# Patient Record
Sex: Male | Born: 1975 | Race: White | Hispanic: No | Marital: Single | Smoking: Former smoker
Health system: Southern US, Community
[De-identification: ages and names within clinical notes are randomized; demographics above are authoritative.]

## PROBLEM LIST (undated history)

## (undated) DIAGNOSIS — I9789 Other postprocedural complications and disorders of the circulatory system, not elsewhere classified: Secondary | ICD-10-CM

## (undated) DIAGNOSIS — I4729 Other ventricular tachycardia: Secondary | ICD-10-CM

## (undated) DIAGNOSIS — Z9289 Personal history of other medical treatment: Secondary | ICD-10-CM

## (undated) DIAGNOSIS — I255 Ischemic cardiomyopathy: Secondary | ICD-10-CM

## (undated) DIAGNOSIS — Z95810 Presence of automatic (implantable) cardiac defibrillator: Secondary | ICD-10-CM

## (undated) DIAGNOSIS — I480 Paroxysmal atrial fibrillation: Secondary | ICD-10-CM

## (undated) DIAGNOSIS — I5022 Chronic systolic (congestive) heart failure: Secondary | ICD-10-CM

## (undated) DIAGNOSIS — I2510 Atherosclerotic heart disease of native coronary artery without angina pectoris: Secondary | ICD-10-CM

## (undated) DIAGNOSIS — K219 Gastro-esophageal reflux disease without esophagitis: Secondary | ICD-10-CM

## (undated) DIAGNOSIS — E785 Hyperlipidemia, unspecified: Secondary | ICD-10-CM

## (undated) DIAGNOSIS — I4891 Unspecified atrial fibrillation: Secondary | ICD-10-CM

## (undated) DIAGNOSIS — Q8901 Asplenia (congenital): Secondary | ICD-10-CM

## (undated) DIAGNOSIS — I252 Old myocardial infarction: Secondary | ICD-10-CM

## (undated) DIAGNOSIS — I10 Essential (primary) hypertension: Secondary | ICD-10-CM

## (undated) DIAGNOSIS — I472 Ventricular tachycardia: Secondary | ICD-10-CM

## (undated) HISTORY — DX: Old myocardial infarction: I25.2

## (undated) HISTORY — DX: Paroxysmal atrial fibrillation: I48.0

## (undated) HISTORY — PX: CORONARY STENT PLACEMENT: SHX1402

## (undated) HISTORY — DX: Personal history of other medical treatment: Z92.89

## (undated) HISTORY — DX: Asplenia (congenital): Q89.01

## (undated) HISTORY — DX: Ischemic cardiomyopathy: I25.5

## (undated) HISTORY — PX: CORONARY ARTERY BYPASS GRAFT: SHX141

## (undated) HISTORY — DX: Other ventricular tachycardia: I47.29

## (undated) HISTORY — DX: Hyperlipidemia, unspecified: E78.5

## (undated) HISTORY — DX: Chronic systolic (congestive) heart failure: I50.22

## (undated) HISTORY — DX: Gastro-esophageal reflux disease without esophagitis: K21.9

## (undated) HISTORY — DX: Other postprocedural complications and disorders of the circulatory system, not elsewhere classified: I97.89

## (undated) HISTORY — PX: SPLENECTOMY, TOTAL: SHX788

## (undated) HISTORY — DX: Ventricular tachycardia: I47.2

## (undated) HISTORY — DX: Presence of automatic (implantable) cardiac defibrillator: Z95.810

## (undated) HISTORY — DX: Unspecified atrial fibrillation: I48.91

## (undated) HISTORY — DX: Essential (primary) hypertension: I10

---

## 2004-01-12 DIAGNOSIS — I252 Old myocardial infarction: Secondary | ICD-10-CM

## 2004-01-12 HISTORY — DX: Old myocardial infarction: I25.2

## 2012-01-04 ENCOUNTER — Encounter (HOSPITAL_COMMUNITY): Payer: Self-pay | Admitting: *Deleted

## 2012-01-04 ENCOUNTER — Emergency Department (HOSPITAL_COMMUNITY)
Admission: EM | Admit: 2012-01-04 | Discharge: 2012-01-04 | Disposition: A | Discharge status: Home / Self Care | Payer: Self-pay | Attending: Emergency Medicine | Admitting: Emergency Medicine

## 2012-01-04 ENCOUNTER — Emergency Department (HOSPITAL_COMMUNITY): Payer: Self-pay

## 2012-01-04 DIAGNOSIS — Z7982 Long term (current) use of aspirin: Secondary | ICD-10-CM | POA: Insufficient documentation

## 2012-01-04 DIAGNOSIS — R51 Headache: Secondary | ICD-10-CM | POA: Insufficient documentation

## 2012-01-04 DIAGNOSIS — F17200 Nicotine dependence, unspecified, uncomplicated: Secondary | ICD-10-CM | POA: Insufficient documentation

## 2012-01-04 DIAGNOSIS — Z9861 Coronary angioplasty status: Secondary | ICD-10-CM | POA: Insufficient documentation

## 2012-01-04 DIAGNOSIS — I2510 Atherosclerotic heart disease of native coronary artery without angina pectoris: Secondary | ICD-10-CM | POA: Insufficient documentation

## 2012-01-04 HISTORY — DX: Atherosclerotic heart disease of native coronary artery without angina pectoris: I25.10

## 2012-01-04 NOTE — ED Provider Notes (Signed)
History     CSN: [id number]  Arrival date & time 01/04/12  1121   First MD Initiated Contact with Patient 01/04/12 1254      Chief Complaint  Patient presents with  . Headache    (Consider location/radiation/quality/duration/timing/severity/associated sxs/prior treatment) HPI  Pt to the ER from [place] visiting family for the holidays with complaints of headaches for the past month. He is bib his mom who has encouraged him to come get "checked out". Around 28 o' clock everyday he develops a left sided temporal headache that radiates behind his ear, down towards his jaw and back towards his occipital region. The pains are shooting and lasts about an hour and then goes away. If he takes Motrin the headaches resolves sooner. He does not have any  change in vision, vomiting, weakness, syncope, chest pains, changes in memory, difficulty talking or any other changes during the headaches. He denies having neck pain or fevers today. He is not currently having any pain. Pt is alert, awake, talking in complete sentences and does not appear toxic. vss nad  Past Medical History  Diagnosis Date  . Coronary artery disease     Past Surgical History  Procedure Date  . Coronary stent placement   . Splenectomy, total     No family history on file.  History  Substance Use Topics  . Smoking status: Current Every Day Smoker  . Smokeless tobacco: Not on file  . Alcohol Use: No      Review of Systems  Review of Systems  Gen: no weight loss, fevers, chills, night sweats  Eyes: no discharge or drainage, no occular pain or visual changes  Nose: no epistaxis or rhinorrhea  Mouth: no dental pain, no sore throat  Neck: no neck pain  Neuro: + headaches, no focal neurologic deficits  Skin: no abnormalities Psyche: negative.   Allergies  Other and Penicillins  Home Medications   Current Outpatient Rx  Name  Route  Sig  Dispense  Refill  . ASPIRIN 325 MG PO TABS   Oral   Take 325 mg  by mouth daily.           BP 148/86  Pulse 85  Temp 98.2 F (36.8 C) (Oral)  Resp 22  SpO2 99%  Physical Exam  Nursing note and vitals reviewed. Constitutional: He is oriented to person, place, and time. He appears well-developed and well-nourished. No distress.  HENT:  Head: Normocephalic and atraumatic.  Eyes: Pupils are equal, round, and reactive to light.  Neck: Normal range of motion. Neck supple. No spinous process tenderness and no muscular tenderness present. No rigidity. No Brudzinski's sign and no Kernig's sign noted.  Cardiovascular: Normal rate and regular rhythm.   Pulmonary/Chest: Effort normal.  Abdominal: Soft.  Neurological: He is alert and oriented to person, place, and time. He has normal strength. No cranial nerve deficit or sensory deficit.  Skin: Skin is warm and dry.    ED Course  Procedures (including critical care time)  Labs Reviewed - No data to display Ct Head Wo Contrast  01/04/2012  *RADIOLOGY REPORT*  Clinical Data:  Temporal headaches  CT HEAD WITHOUT CONTRAST  Technique:  Contiguous axial images were obtained from the base of the skull through the vertex without contrast  Comparison:  None.  Findings:  The brain has a normal appearance without evidence for hemorrhage, acute infarction, hydrocephalus, or mass lesion.  There is no extra axial fluid collection.  The skull and paranasal sinuses are  normal.  IMPRESSION: Normal CT of the head without contrast.   Original Report Authenticated By: Judie Petit. Shick, M.D.      1. Headache       MDM  Pt not currently having any symptoms. His head CT is negative. His physical exam shows no concerning findings warranting further evaluation.Pt is from [place] and advised to follow-up with his PCP or a Neurologist back at home for continued care and monitoring regarding his recurrent chronic headaches.  Pt has been advised of the symptoms that warrant their return to the ED. Patient has voiced understanding and  has agreed to follow-up with the PCP or specialist.         Dorthula Matas, PA 01/05/12 1104

## 2012-01-04 NOTE — ED Notes (Signed)
Pt presents to department for evaluation of headache. Ongoing x1 month. States pain begins to L temporal area and radiates behind ear and into tooth. 8/10 pain at the time. Pt is conscious alert and oriented x4. No neurological deficits noted. No nausea/vomiting. No blurred vision.

## 2012-01-04 NOTE — ED Notes (Signed)
Pt is here with headache for one month to left side temporal area that radiates behind ear and into tooth.  Pt states he never gets headaches.  No other associated symptoms

## 2012-01-05 NOTE — ED Provider Notes (Signed)
Medical screening examination/treatment/procedure(s) were performed by non-physician practitioner and as supervising physician I was immediately available for consultation/collaboration.  Merlina Marchena R. Jayanna Kroeger, MD 01/05/12 1538 

## 2015-01-12 DIAGNOSIS — Z95810 Presence of automatic (implantable) cardiac defibrillator: Secondary | ICD-10-CM

## 2015-01-12 HISTORY — DX: Presence of automatic (implantable) cardiac defibrillator: Z95.810

## 2016-04-03 ENCOUNTER — Encounter (HOSPITAL_COMMUNITY): Payer: Self-pay

## 2016-04-03 ENCOUNTER — Emergency Department (HOSPITAL_COMMUNITY): Payer: BLUE CROSS/BLUE SHIELD

## 2016-04-03 ENCOUNTER — Emergency Department (HOSPITAL_COMMUNITY)
Admission: EM | Admit: 2016-04-03 | Discharge: 2016-04-04 | Disposition: A | Discharge status: Home / Self Care | Payer: BLUE CROSS/BLUE SHIELD | Attending: Emergency Medicine | Admitting: Emergency Medicine

## 2016-04-03 DIAGNOSIS — F17210 Nicotine dependence, cigarettes, uncomplicated: Secondary | ICD-10-CM | POA: Insufficient documentation

## 2016-04-03 DIAGNOSIS — I509 Heart failure, unspecified: Secondary | ICD-10-CM | POA: Diagnosis not present

## 2016-04-03 DIAGNOSIS — Z951 Presence of aortocoronary bypass graft: Secondary | ICD-10-CM | POA: Diagnosis not present

## 2016-04-03 DIAGNOSIS — J181 Lobar pneumonia, unspecified organism: Secondary | ICD-10-CM | POA: Insufficient documentation

## 2016-04-03 DIAGNOSIS — Z955 Presence of coronary angioplasty implant and graft: Secondary | ICD-10-CM | POA: Diagnosis not present

## 2016-04-03 DIAGNOSIS — Z95810 Presence of automatic (implantable) cardiac defibrillator: Secondary | ICD-10-CM | POA: Diagnosis not present

## 2016-04-03 DIAGNOSIS — I2510 Atherosclerotic heart disease of native coronary artery without angina pectoris: Secondary | ICD-10-CM | POA: Diagnosis not present

## 2016-04-03 DIAGNOSIS — J189 Pneumonia, unspecified organism: Secondary | ICD-10-CM

## 2016-04-03 DIAGNOSIS — I110 Hypertensive heart disease with heart failure: Secondary | ICD-10-CM | POA: Insufficient documentation

## 2016-04-03 DIAGNOSIS — Z7982 Long term (current) use of aspirin: Secondary | ICD-10-CM | POA: Insufficient documentation

## 2016-04-03 DIAGNOSIS — R509 Fever, unspecified: Secondary | ICD-10-CM | POA: Diagnosis present

## 2016-04-03 NOTE — ED Provider Notes (Signed)
[facility]-EMERGENCY DEPT Provider Note   CSN: [id number] Arrival date & time: 04/03/16  2308  By signing my name below, I, Nelwyn Salisbury, attest that this documentation has been prepared under the direction and in the presence of Charlynne Pander, MD . Electronically Signed: Nelwyn Salisbury, Scribe. 04/03/2016. 11:46 PM.  History   Chief Complaint Chief Complaint  Patient presents with  . Rib Pain  . Fever   The history is provided by the patient. No language interpreter was used.    HPI Comments:  William Rubio is a 41 y.o. male hx of CABG 4 months ago here with L rib pain, chills. Had subjective fever at home. This evening, had left sided rib pain and some subjective shortness of breath and cough. Pain is constant, worse with deep breathing. Denies any chest pain. Denies hx of blood clots or DVT. CABG was done in Oct 2017 and was uncomplicated. Didn't take any tylenol or motrin prior to arrival. Denies sick contacts.   Past Medical History:  Diagnosis Date  . Coronary artery disease     There are no active problems to display for this patient.   Past Surgical History:  Procedure Laterality Date  . CORONARY STENT PLACEMENT    . SPLENECTOMY, TOTAL      Home Medications    Prior to Admission medications   Medication Sig Start Date End Date Taking? Authorizing Provider  aspirin EC 81 MG tablet Take 81 mg by mouth daily.   Yes Historical Provider, MD  furosemide (LASIX) 20 MG tablet Take 20 mg by mouth daily. 02/06/16  Yes Historical Provider, MD  lisinopril (PRINIVIL,ZESTRIL) 20 MG tablet Take 20 mg by mouth daily. 02/06/16  Yes Historical Provider, MD  metoprolol (LOPRESSOR) 50 MG tablet Take 50 mg by mouth 2 (two) times daily. 02/06/16  Yes Historical Provider, MD  omeprazole (PRILOSEC OTC) 20 MG tablet Take 20 mg by mouth daily.   Yes Historical Provider, MD  pravastatin (PRAVACHOL) 80 MG tablet Take 80 mg by mouth daily. 02/09/16  Yes Historical Provider, MD    Family  History History reviewed. No pertinent family history.  Social History Social History  Substance Use Topics  . Smoking status: Current Every Day Smoker    Types: E-cigarettes  . Smokeless tobacco: Never Used  . Alcohol use No     Allergies   Other and Penicillins   Review of Systems Review of Systems  Constitutional: Positive for fever.  Cardiovascular: Positive for chest pain (Chest Wall - Left Rib).  Neurological: Positive for headaches.  All other systems reviewed and are negative.    Physical Exam Updated Vital Signs BP (!) 127/98   Pulse 93   Temp (S) 99.4 F (37.4 C) (Oral)   Resp 18   Ht 5\' 10"  (1.778 m)   Wt 265 lb 4 oz (120.3 kg)   SpO2 95%   BMI 38.06 kg/m   Physical Exam  Constitutional: He is oriented to person, place, and time. He appears well-developed and well-nourished. No distress.  HENT:  Head: Normocephalic and atraumatic.  Eyes: Conjunctivae and EOM are normal.  Neck: Normal range of motion.  Cardiovascular: Normal rate, regular rhythm, normal heart sounds and intact distal pulses.   Pulmonary/Chest: Effort normal and breath sounds normal. No respiratory distress.  Crackles in lower-left base  Abdominal: Soft. He exhibits no distension. There is no tenderness.  Musculoskeletal: Normal range of motion.  Neurological: He is alert and oriented to person, place, and time.  Skin:  Skin is warm and dry.  Psychiatric: He has a normal mood and affect. Judgment normal.  Nursing note and vitals reviewed.    ED Treatments / Results  DIAGNOSTIC STUDIES:  Oxygen Saturation is 97% on RA, normal by my interpretation.    COORDINATION OF CARE:  12:03 AM Discussed treatment plan with pt at bedside which includes labwork and EKG and pt agreed to plan.  Labs (all labs ordered are listed, but only abnormal results are displayed) Labs Reviewed  CBC WITH DIFFERENTIAL/PLATELET - Abnormal; Notable for the following:       Result Value   WBC 18.9 (*)     Neutro Abs 12.1 (*)    Lymphs Abs 4.2 (*)    Monocytes Absolute 2.6 (*)    All other components within normal limits  COMPREHENSIVE METABOLIC PANEL - Abnormal; Notable for the following:    Chloride 97 (*)    Glucose, Bld 108 (*)    All other components within normal limits  URINALYSIS, ROUTINE W REFLEX MICROSCOPIC - Abnormal; Notable for the following:    Color, Urine STRAW (*)    All other components within normal limits  CULTURE, BLOOD (ROUTINE X 2)  CULTURE, BLOOD (ROUTINE X 2)  I-STAT CG4 LACTIC ACID, ED  I-STAT TROPOININ, ED    EKG  EKG Interpretation  Date/Time:  Saturday April 03 2016 23:46:33 EDT Ventricular Rate:  95 PR Interval:    QRS Duration: 90 QT Interval:  332 QTC Calculation: 418 R Axis:   15 Text Interpretation:  Sinus rhythm Prolonged PR interval Probable left atrial enlargement Probable anterior infarct, acute ST elevation, consider inferior injury Lateral leads are also involved Baseline wander in lead(s) V1 V6 No previous ECGs available Confirmed by Virlan Kempker  MD, Charbel Los ([id number]) on 04/03/2016 11:50:59 PM       Radiology Dg Chest 2 View  Result Date: 04/04/2016 CLINICAL DATA:  Left side rib pain, fever, slight sob all today, HTN, ex-smoker. Hx CABG and defibrillator placement 10/2015 EXAM: CHEST  2 VIEW COMPARISON:  None. FINDINGS: Status post median sternotomy. Left-sided transvenous pacemaker lead to the right ventricle. The heart is mildly enlarged. Right lung is clear. There is opacity at the posterior left lung base which obscures the posterior portion of the hemidiaphragm. Findings are consistent with atelectasis or infiltrate. No pneumothorax or acute rib fractures are identified. IMPRESSION: 1. Opacity at the left lung base consistent with atelectasis or more likely, infiltrate. Followup PA and lateral chest X-ray is recommended in 3-4 weeks following trial of antibiotic therapy to ensure resolution and exclude underlying malignancy. 2. Cardiomegaly without  pulmonary edema. Electronically Signed   By: Norva Pavlov M.D.   On: 04/04/2016 00:05    Procedures Procedures (including critical care time)  Medications Ordered in ED Medications  levofloxacin (LEVAQUIN) IVPB 750 mg (750 mg Intravenous New Bag/Given 04/04/16 0103)  acetaminophen (TYLENOL) tablet 1,000 mg (1,000 mg Oral Given 04/04/16 0059)  sodium chloride 0.9 % bolus 1,000 mL (0 mLs Intravenous Stopped 04/04/16 0159)  traMADol (ULTRAM) tablet 50 mg (50 mg Oral Given 04/04/16 0122)     Initial Impression / Assessment and Plan / ED Course  I have reviewed the triage vital signs and the nursing notes.  Pertinent labs & imaging results that were available during my care of the patient were reviewed by me and considered in my medical decision making (see chart for details).     William Rubio is a 41 y.o. male here with cough, fever. Febrile 101.5  F in the ED. Has crackles L base. I am concerned for possible pneumonia vs flu. Patient had CABG 4 months ago so will treat for CAP if he has pneumonia. Will get labs, CXR   2:21 AM Labs showed WBC 18. Lactate nl. CXR showed L base atelectasis vs pneumonia. I think likely pneumonia clinically. Never hypoxic, not tachycardic. Given levaquin in the ED, will dc home with the same. Given return precautions.    Final Clinical Impressions(s) / ED Diagnoses   Final diagnoses:  None    New Prescriptions New Prescriptions   No medications on file  I personally performed the services described in this documentation, which was scribed in my presence. The recorded information has been reviewed and is accurate.     Charlynne Panderavid Hsienta William Laske, MD 04/04/16 Earle [id number]

## 2016-04-03 NOTE — ED Triage Notes (Signed)
Pt complaining of L lower rib pain. Pt denies any fall, injury or trauma. Pt states fevers today. Pt states bypass sx ~4 months ago. Pt denies any chest pain or SOB.

## 2016-04-04 LAB — URINALYSIS, ROUTINE W REFLEX MICROSCOPIC
Bilirubin Urine: NEGATIVE
GLUCOSE, UA: NEGATIVE mg/dL
HGB URINE DIPSTICK: NEGATIVE
Ketones, ur: NEGATIVE mg/dL
Leukocytes, UA: NEGATIVE
Nitrite: NEGATIVE
PROTEIN: NEGATIVE mg/dL
Specific Gravity, Urine: 1.006 (ref 1.005–1.030)
pH: 6 (ref 5.0–8.0)

## 2016-04-04 LAB — CBC WITH DIFFERENTIAL/PLATELET
BASOS ABS: 0 10*3/uL (ref 0.0–0.1)
BASOS PCT: 0 %
Eosinophils Absolute: 0 10*3/uL (ref 0.0–0.7)
Eosinophils Relative: 0 %
HEMATOCRIT: 45.9 % (ref 39.0–52.0)
HEMOGLOBIN: 15.9 g/dL (ref 13.0–17.0)
Lymphocytes Relative: 22 %
Lymphs Abs: 4.2 10*3/uL — ABNORMAL HIGH (ref 0.7–4.0)
MCH: 31 pg (ref 26.0–34.0)
MCHC: 34.6 g/dL (ref 30.0–36.0)
MCV: 89.5 fL (ref 78.0–100.0)
MONOS PCT: 14 %
Monocytes Absolute: 2.6 10*3/uL — ABNORMAL HIGH (ref 0.1–1.0)
NEUTROS ABS: 12.1 10*3/uL — AB (ref 1.7–7.7)
Neutrophils Relative %: 64 %
Platelets: 353 10*3/uL (ref 150–400)
RBC: 5.13 MIL/uL (ref 4.22–5.81)
RDW: 14.7 % (ref 11.5–15.5)
WBC: 18.9 10*3/uL — ABNORMAL HIGH (ref 4.0–10.5)

## 2016-04-04 LAB — COMPREHENSIVE METABOLIC PANEL
ALK PHOS: 64 U/L (ref 38–126)
ALT: 21 U/L (ref 17–63)
ANION GAP: 12 (ref 5–15)
AST: 18 U/L (ref 15–41)
Albumin: 4.5 g/dL (ref 3.5–5.0)
BILIRUBIN TOTAL: 0.8 mg/dL (ref 0.3–1.2)
BUN: 13 mg/dL (ref 6–20)
CO2: 26 mmol/L (ref 22–32)
Calcium: 9.9 mg/dL (ref 8.9–10.3)
Chloride: 97 mmol/L — ABNORMAL LOW (ref 101–111)
Creatinine, Ser: 0.93 mg/dL (ref 0.61–1.24)
GLUCOSE: 108 mg/dL — AB (ref 65–99)
Potassium: 4.1 mmol/L (ref 3.5–5.1)
SODIUM: 135 mmol/L (ref 135–145)
TOTAL PROTEIN: 7.6 g/dL (ref 6.5–8.1)

## 2016-04-04 LAB — I-STAT TROPONIN, ED: Troponin i, poc: 0 ng/mL (ref 0.00–0.08)

## 2016-04-04 LAB — I-STAT CG4 LACTIC ACID, ED: Lactic Acid, Venous: 1.39 mmol/L (ref 0.5–1.9)

## 2016-04-04 MED ORDER — LEVOFLOXACIN IN D5W 750 MG/150ML IV SOLN
750.0000 mg | Freq: Once | INTRAVENOUS | Status: AC
  Administered 2016-04-04: 750 mg via INTRAVENOUS
  Filled 2016-04-04: qty 150

## 2016-04-04 MED ORDER — ACETAMINOPHEN 500 MG PO TABS
1000.0000 mg | ORAL_TABLET | Freq: Once | ORAL | Status: AC
  Administered 2016-04-04: 1000 mg via ORAL
  Filled 2016-04-04: qty 2

## 2016-04-04 MED ORDER — SODIUM CHLORIDE 0.9 % IV BOLUS (SEPSIS)
1000.0000 mL | Freq: Once | INTRAVENOUS | Status: AC
  Administered 2016-04-04: 1000 mL via INTRAVENOUS

## 2016-04-04 MED ORDER — TRAMADOL HCL 50 MG PO TABS
50.0000 mg | ORAL_TABLET | Freq: Once | ORAL | Status: AC
Start: 2016-04-04 — End: 2016-04-04
  Administered 2016-04-04: 50 mg via ORAL
  Filled 2016-04-04: qty 1

## 2016-04-04 MED ORDER — LEVOFLOXACIN 750 MG PO TABS: 750.0000 mg | ORAL_TABLET | Freq: Every day | ORAL | 0 refills | Status: DC

## 2016-04-04 MED ORDER — TRAMADOL HCL 50 MG PO TABS: 50.0000 mg | ORAL_TABLET | Freq: Four times a day (QID) | ORAL | 0 refills | Status: DC | PRN

## 2016-04-04 NOTE — ED Notes (Signed)
Patient Alert and oriented X4. Stable and ambulatory. Patient verbalized understanding of the discharge instructions.  Patient belongings were taken by the patient.  

## 2016-04-04 NOTE — Discharge Instructions (Signed)
Take motrin, tylenol for fever.   Take levaquin daily for a week for pneumonia.   Please don't exercise much for the next week as levaquin may increase the risk for tendon rupture.   See your doctor  Take tramadol for severe pain   Return to ER if you have fever for a week, worse chest pain, trouble breathing, shortness of breath, leg swelling.

## 2016-04-05 ENCOUNTER — Encounter (HOSPITAL_COMMUNITY)
Admission: EM | Disposition: A | Discharge status: Home / Self Care | Payer: Self-pay | Source: Home / Self Care | Attending: Internal Medicine

## 2016-04-05 ENCOUNTER — Inpatient Hospital Stay (HOSPITAL_COMMUNITY)
Admission: EM | Admit: 2016-04-05 | Discharge: 2016-04-08 | DRG: 871 | Disposition: A | Discharge status: Home / Self Care | Payer: BLUE CROSS/BLUE SHIELD | Attending: Internal Medicine | Admitting: Internal Medicine

## 2016-04-05 ENCOUNTER — Emergency Department (HOSPITAL_COMMUNITY): Payer: BLUE CROSS/BLUE SHIELD

## 2016-04-05 ENCOUNTER — Encounter (HOSPITAL_COMMUNITY): Payer: Self-pay

## 2016-04-05 DIAGNOSIS — Z9889 Other specified postprocedural states: Secondary | ICD-10-CM

## 2016-04-05 DIAGNOSIS — F17290 Nicotine dependence, other tobacco product, uncomplicated: Secondary | ICD-10-CM | POA: Diagnosis present

## 2016-04-05 DIAGNOSIS — J90 Pleural effusion, not elsewhere classified: Secondary | ICD-10-CM

## 2016-04-05 DIAGNOSIS — K219 Gastro-esophageal reflux disease without esophagitis: Secondary | ICD-10-CM | POA: Diagnosis present

## 2016-04-05 DIAGNOSIS — E872 Acidosis: Secondary | ICD-10-CM | POA: Diagnosis present

## 2016-04-05 DIAGNOSIS — Z6838 Body mass index (BMI) 38.0-38.9, adult: Secondary | ICD-10-CM

## 2016-04-05 DIAGNOSIS — I252 Old myocardial infarction: Secondary | ICD-10-CM | POA: Insufficient documentation

## 2016-04-05 DIAGNOSIS — I25709 Atherosclerosis of coronary artery bypass graft(s), unspecified, with unspecified angina pectoris: Secondary | ICD-10-CM | POA: Diagnosis not present

## 2016-04-05 DIAGNOSIS — I248 Other forms of acute ischemic heart disease: Secondary | ICD-10-CM | POA: Diagnosis present

## 2016-04-05 DIAGNOSIS — A419 Sepsis, unspecified organism: Secondary | ICD-10-CM | POA: Diagnosis not present

## 2016-04-05 DIAGNOSIS — J181 Lobar pneumonia, unspecified organism: Secondary | ICD-10-CM | POA: Diagnosis not present

## 2016-04-05 DIAGNOSIS — E669 Obesity, unspecified: Secondary | ICD-10-CM | POA: Diagnosis present

## 2016-04-05 DIAGNOSIS — Z9081 Acquired absence of spleen: Secondary | ICD-10-CM

## 2016-04-05 DIAGNOSIS — I25810 Atherosclerosis of coronary artery bypass graft(s) without angina pectoris: Secondary | ICD-10-CM | POA: Diagnosis present

## 2016-04-05 DIAGNOSIS — Z880 Allergy status to penicillin: Secondary | ICD-10-CM

## 2016-04-05 DIAGNOSIS — I2121 ST elevation (STEMI) myocardial infarction involving left circumflex coronary artery: Secondary | ICD-10-CM

## 2016-04-05 DIAGNOSIS — I2510 Atherosclerotic heart disease of native coronary artery without angina pectoris: Secondary | ICD-10-CM

## 2016-04-05 DIAGNOSIS — E785 Hyperlipidemia, unspecified: Secondary | ICD-10-CM | POA: Diagnosis present

## 2016-04-05 DIAGNOSIS — Z7982 Long term (current) use of aspirin: Secondary | ICD-10-CM

## 2016-04-05 DIAGNOSIS — R0602 Shortness of breath: Secondary | ICD-10-CM

## 2016-04-05 DIAGNOSIS — R509 Fever, unspecified: Secondary | ICD-10-CM | POA: Diagnosis not present

## 2016-04-05 DIAGNOSIS — Z955 Presence of coronary angioplasty implant and graft: Secondary | ICD-10-CM

## 2016-04-05 DIAGNOSIS — I255 Ischemic cardiomyopathy: Secondary | ICD-10-CM | POA: Diagnosis present

## 2016-04-05 DIAGNOSIS — R079 Chest pain, unspecified: Secondary | ICD-10-CM | POA: Diagnosis present

## 2016-04-05 DIAGNOSIS — I5022 Chronic systolic (congestive) heart failure: Secondary | ICD-10-CM | POA: Diagnosis not present

## 2016-04-05 DIAGNOSIS — Z79899 Other long term (current) drug therapy: Secondary | ICD-10-CM

## 2016-04-05 DIAGNOSIS — J189 Pneumonia, unspecified organism: Secondary | ICD-10-CM

## 2016-04-05 DIAGNOSIS — Z95810 Presence of automatic (implantable) cardiac defibrillator: Secondary | ICD-10-CM

## 2016-04-05 DIAGNOSIS — Z91030 Bee allergy status: Secondary | ICD-10-CM

## 2016-04-05 LAB — BASIC METABOLIC PANEL
Anion gap: 14 (ref 5–15)
BUN: 11 mg/dL (ref 6–20)
CHLORIDE: 96 mmol/L — AB (ref 101–111)
CO2: 25 mmol/L (ref 22–32)
Calcium: 9.5 mg/dL (ref 8.9–10.3)
Creatinine, Ser: 1 mg/dL (ref 0.61–1.24)
GFR calc Af Amer: >60 mL/min (ref >60)
GLUCOSE: 122 mg/dL — AB (ref 65–99)
Potassium: 4.4 mmol/L (ref 3.5–5.1)
SODIUM: 135 mmol/L (ref 135–145)

## 2016-04-05 LAB — CBC
HCT: 42.2 % (ref 39.0–52.0)
Hemoglobin: 14.9 g/dL (ref 13.0–17.0)
MCH: 31.2 pg (ref 26.0–34.0)
MCHC: 35.3 g/dL (ref 30.0–36.0)
MCV: 88.3 fL (ref 78.0–100.0)
Platelets: 348 10*3/uL (ref 150–400)
RBC: 4.78 MIL/uL (ref 4.22–5.81)
RDW: 14.3 % (ref 11.5–15.5)
WBC: 24.7 10*3/uL — ABNORMAL HIGH (ref 4.0–10.5)

## 2016-04-05 LAB — I-STAT CG4 LACTIC ACID, ED: LACTIC ACID, VENOUS: 2.28 mmol/L — AB (ref 0.5–1.9)

## 2016-04-05 LAB — I-STAT TROPONIN, ED: Troponin i, poc: 0 ng/mL (ref 0.00–0.08)

## 2016-04-05 SURGERY — INVASIVE LAB ABORTED CASE

## 2016-04-05 MED ORDER — SODIUM CHLORIDE 0.9 % IV BOLUS (SEPSIS)
1000.0000 mL | Freq: Once | INTRAVENOUS | Status: AC
  Administered 2016-04-05: 1000 mL via INTRAVENOUS

## 2016-04-05 MED ORDER — HYDROMORPHONE HCL 1 MG/ML IJ SOLN
1.0000 mg | Freq: Once | INTRAMUSCULAR | Status: AC
  Administered 2016-04-05: 1 mg via INTRAVENOUS
  Filled 2016-04-05: qty 1

## 2016-04-05 MED ORDER — DEXTROSE 5 % IV SOLN
1.0000 g | Freq: Three times a day (TID) | INTRAVENOUS | Status: DC
  Filled 2016-04-05 (×2): qty 1

## 2016-04-05 MED ORDER — HEPARIN SODIUM (PORCINE) 5000 UNIT/ML IJ SOLN
INTRAMUSCULAR | Status: AC
  Administered 2016-04-05: 4000 [IU]
  Filled 2016-04-05: qty 1

## 2016-04-05 MED ORDER — ACETAMINOPHEN 325 MG PO TABS: 650.0000 mg | ORAL_TABLET | Freq: Four times a day (QID) | ORAL | Status: DC | PRN

## 2016-04-05 MED ORDER — GUAIFENESIN ER 600 MG PO TB12
600.0000 mg | ORAL_TABLET | Freq: Two times a day (BID) | ORAL | Status: DC
  Administered 2016-04-06 – 2016-04-08 (×6): 600 mg via ORAL
  Filled 2016-04-05 (×6): qty 1

## 2016-04-05 MED ORDER — ENOXAPARIN SODIUM 40 MG/0.4ML ~~LOC~~ SOLN
40.0000 mg | Freq: Every day | SUBCUTANEOUS | Status: DC
  Administered 2016-04-06 – 2016-04-07 (×3): 40 mg via SUBCUTANEOUS
  Filled 2016-04-05 (×3): qty 0.4

## 2016-04-05 MED ORDER — DEXTROSE 5 % IV SOLN
2.0000 g | Freq: Three times a day (TID) | INTRAVENOUS | Status: DC
  Administered 2016-04-05 – 2016-04-06 (×3): 2 g via INTRAVENOUS
  Filled 2016-04-05 (×2): qty 2

## 2016-04-05 MED ORDER — GI COCKTAIL ~~LOC~~: 30.0000 mL | Freq: Four times a day (QID) | ORAL | Status: DC | PRN

## 2016-04-05 MED ORDER — ASPIRIN 81 MG PO CHEW
CHEWABLE_TABLET | ORAL | Status: AC
  Administered 2016-04-05: 324 mg
  Filled 2016-04-05: qty 4

## 2016-04-05 MED ORDER — IPRATROPIUM-ALBUTEROL 0.5-2.5 (3) MG/3ML IN SOLN: 3.0000 mL | RESPIRATORY_TRACT | Status: DC | PRN

## 2016-04-05 MED ORDER — ACETAMINOPHEN 325 MG PO TABS: 650.0000 mg | ORAL_TABLET | Freq: Once | ORAL | Status: DC

## 2016-04-05 MED ORDER — PANTOPRAZOLE SODIUM 40 MG PO TBEC
40.0000 mg | DELAYED_RELEASE_TABLET | Freq: Every day | ORAL | Status: DC
  Administered 2016-04-06 – 2016-04-08 (×3): 40 mg via ORAL
  Filled 2016-04-05 (×3): qty 1

## 2016-04-05 MED ORDER — VANCOMYCIN HCL 10 G IV SOLR
2000.0000 mg | Freq: Once | INTRAVENOUS | Status: AC
  Administered 2016-04-05: 2000 mg via INTRAVENOUS
  Filled 2016-04-05: qty 2000

## 2016-04-05 MED ORDER — ACETAMINOPHEN 325 MG PO TABS: 650.0000 mg | ORAL_TABLET | ORAL | Status: DC | PRN

## 2016-04-05 MED ORDER — VANCOMYCIN HCL 10 G IV SOLR
1500.0000 mg | Freq: Two times a day (BID) | INTRAVENOUS | Status: DC
  Administered 2016-04-06 – 2016-04-07 (×4): 1500 mg via INTRAVENOUS
  Filled 2016-04-05 (×5): qty 1500

## 2016-04-05 MED ORDER — LISINOPRIL 10 MG PO TABS
20.0000 mg | ORAL_TABLET | Freq: Every day | ORAL | Status: DC
  Administered 2016-04-06 – 2016-04-08 (×3): 20 mg via ORAL
  Filled 2016-04-05 (×3): qty 2

## 2016-04-05 MED ORDER — PRAVASTATIN SODIUM 40 MG PO TABS
80.0000 mg | ORAL_TABLET | Freq: Every day | ORAL | Status: DC
  Administered 2016-04-06 – 2016-04-07 (×2): 80 mg via ORAL
  Filled 2016-04-05 (×2): qty 2

## 2016-04-05 MED ORDER — METOPROLOL TARTRATE 50 MG PO TABS
50.0000 mg | ORAL_TABLET | Freq: Two times a day (BID) | ORAL | Status: DC
  Administered 2016-04-06 – 2016-04-08 (×5): 50 mg via ORAL
  Filled 2016-04-05 (×5): qty 1

## 2016-04-05 MED ORDER — MORPHINE SULFATE (PF) 4 MG/ML IV SOLN
2.0000 mg | INTRAVENOUS | Status: DC | PRN
  Administered 2016-04-06 – 2016-04-07 (×9): 2 mg via INTRAVENOUS
  Filled 2016-04-05 (×10): qty 1

## 2016-04-05 MED ORDER — ONDANSETRON HCL 4 MG/2ML IJ SOLN: 4.0000 mg | Freq: Four times a day (QID) | INTRAMUSCULAR | Status: DC | PRN

## 2016-04-05 MED ORDER — ACETAMINOPHEN 325 MG PO TABS
650.0000 mg | ORAL_TABLET | ORAL | Status: AC
  Administered 2016-04-05: 650 mg via ORAL
  Filled 2016-04-05: qty 2

## 2016-04-05 MED ORDER — ASPIRIN EC 81 MG PO TBEC
81.0000 mg | DELAYED_RELEASE_TABLET | Freq: Every day | ORAL | Status: DC
  Administered 2016-04-06 – 2016-04-08 (×3): 81 mg via ORAL
  Filled 2016-04-05 (×3): qty 1

## 2016-04-05 MED ORDER — FUROSEMIDE 20 MG PO TABS
20.0000 mg | ORAL_TABLET | Freq: Every day | ORAL | Status: DC
  Administered 2016-04-06 – 2016-04-08 (×3): 20 mg via ORAL
  Filled 2016-04-05 (×3): qty 1

## 2016-04-05 MED ORDER — ONDANSETRON HCL 4 MG/2ML IJ SOLN
4.0000 mg | Freq: Once | INTRAMUSCULAR | Status: AC
  Administered 2016-04-05: 4 mg via INTRAVENOUS
  Filled 2016-04-05: qty 2

## 2016-04-05 SURGICAL SUPPLY — 5 items
KIT ENCORE 26 ADVANTAGE (KITS) IMPLANT
KIT HEART LEFT (KITS) IMPLANT
PACK CARDIAC CATHETERIZATION (CUSTOM PROCEDURE TRAY) IMPLANT
TRANSDUCER W/STOPCOCK (MISCELLANEOUS) IMPLANT
TUBING CIL FLEX 10 FLL-RA (TUBING) IMPLANT

## 2016-04-05 NOTE — ED Notes (Signed)
hospitalist at bedside

## 2016-04-05 NOTE — ED Notes (Signed)
Dr. Case is his cardiologist in [place]. Pt reports he just moved here.

## 2016-04-05 NOTE — ED Notes (Signed)
Dr. Effie ShyWentz in triage to assess patient.

## 2016-04-05 NOTE — ED Notes (Addendum)
Name: William Rubio is a 41 y.o. male Admit date: 04/05/2016 Referring Physician:  Dr. Mancel Bale Primary Physician:  None Primary Cardiologist:  New  Reason for ED Evaluation: STEMI ACTIVATION by Dr. Mancel Bale  ASSESSMENT: 1. Recent febrile illness with temperature greater than 102F diagnosis of left lower lobe pneumonia made an ER. Chest x-ray now reveals left lower lung effusion and larger infiltrate 2. Pleuritic chest and back discomfort likely related to #1 with possible pleurisy/possible empyema. 3. Coronary artery disease with prior LAD stent and ultimately coronary bypass grafting with LIMA to LAD 4 months ago (outside hospital).  No current evidence of acute ongoing ischemia 4. Presumed ischemic cardiomyopathy with chronic systolic heart failure but no evidence of volume overload 5. Automatic implantable cardiac defibrillator implanted 4 months ago (outside hospital)  6. Hyperlipidemia  PLAN:  1. Admit to the general medical service 2. Pulmonary consultation 3. Tap pleural effusion to rule out empyema 4. Continue appropriate antibiotic therapy. 5. Blood cultures to exclude bacteremia/endocarditis or device infection 6. Will need to be followed by cardiology along with internal medicine 7. Will need to obtain cardiac records from his cardiologist in the [place] area, to be certain of his cardiac diagnoses as above is based purely on history from the patient which seems uncertain. 8. BNP 9. Cycle cardiac markers to be certain there is no evidence of myocardial injury. 10. Cancel STEMI activation   HPI: 41 year old gentleman with recent diagnosis of left lower lobe pneumonia treated with Levaquin as an outpatient starting 48 hours ago who returns to the emergency room today with complaint of lower chest/subcostal and back discomfort that is intensified by deep breathing. He became concerned he may be having a heart attack since his initial cardiac event started  with back discomfort 15 years ago. At that time he was found to have an obstructed coronary artery and had stent implantation. Since being seen in the emergency room 48 hours ago, he has continued to run a low-grade temperature. He denies cough and phlegm production. Tylenol helps a temperature to resolve but then it recurs  Cardiac history is notable for coronary stent initially place 13-15 years ago in [facility] [place]. Several years later he was diagnosed with having a weak heart. He cannot tell me if the initial stent was in the setting of acute infarction. 4 months ago he was admitted with dyspnea and fatigue and told that his stent had clogged up and "bypass surgery" was performed. He did not having harvested from his legs or arterial conduit was taken from his arms. Presumption is the bypass was done with a LIMA to the LAD. Current symptoms are dissimilar to those that led to hospitalization prior to recent bypass grafting. An AICD was placed at the time of surgery. According to the patient, his cardiologist had been encouraging and AICD for several years but he had refused until recently.  PMH:   Past Medical History:  Diagnosis Date  . AICD (automatic cardioverter/defibrillator) present 2018  . CHF (congestive heart failure) (HCC)   . Coronary artery disease   . Hyperlipidemia     PSH:   Past Surgical History:  Procedure Laterality Date  . CORONARY ARTERY BYPASS GRAFT    . CORONARY STENT PLACEMENT    . SPLENECTOMY, TOTAL     Allergies:  Other and Penicillins Prior to Admit Meds:   (Not in a hospital admission) Fam HX:   History reviewed. No pertinent family history. Social HX:    Social  History   Social History  . Marital status: Single    Spouse name: N/A  . Number of children: N/A  . Years of education: N/A   Occupational History  . Not on file.   Social History Main Topics  . Smoking status: Current Every Day Smoker    Types: E-cigarettes  . Smokeless tobacco: Never  Used  . Alcohol use No  . Drug use: No  . Sexual activity: Not on file   Other Topics Concern  . Not on file   Social History Narrative  . No narrative on file     Review of Systems: Chills and low-grade fever. Significant mechanical back problems causing him to change careers. He is employed as a Armed forces operational officerdental hygienist. No history of lung disease. Appetite is been stable. No nausea or vomiting. He denies dyspnea.   All other systems are negative.  Physical Exam: Blood pressure 131/78, pulse 98, temperature (!) 100.4 F (38 C), temperature source Oral, resp. rate 18, SpO2 95 %. Weight change:    Appears anxious. In no acute distress. Skin is warm and dry. HEENT exam reveals pupils are equal and reactive. No jaundice is noted. Neck exam reveals no JVD or carotid bruits. Chest exam reveals diminished breath sounds at the left base with rales. There is dullness to percussion. Cardiac exam reveals an S4 gallop. No rub, click, or murmur is heard. The PMI is nonpalpable. Abdomin is soft. No tenderness is noted. The liver is nonpalpable. Extremities reveal no edema. Radial pulses and posterior tibial pulses are 2+ equal. Neurological exam reveals no focal motor or sensory deficits. Psychological exam reveals patient who is very anxious  Labs: Lab Results  Component Value Date   WBC 24.7 (H) 04/05/2016   HGB 14.9 04/05/2016   HCT 42.2 04/05/2016   MCV 88.3 04/05/2016   PLT 348 04/05/2016     Recent Labs Lab 04/04/16 0011 04/05/16 1746  NA 135 135  K 4.1 4.4  CL 97* 96*  CO2 26 25  BUN 13 11  CREATININE 0.93 1.00  CALCIUM 9.9 9.5  PROT 7.6  --   BILITOT 0.8  --   ALKPHOS 64  --   ALT 21  --   AST 18  --   GLUCOSE 108* 122*   No results found for: PTT No results found for: INR, PROTIME No results found for: CKTOTAL, CKMB, CKMBINDEX, TROPONINI  No results found for: CHOL No results found for: HDL No results found for: LDLCALC No results found for: TRIG No results  found for: CHOLHDL No results found for: LDLDIRECT    Radiology:  Dg Chest 2 View  Result Date: 04/04/2016 CLINICAL DATA:  Left side rib pain, fever, slight sob all today, HTN, ex-smoker. Hx CABG and defibrillator placement 10/2015 EXAM: CHEST  2 VIEW COMPARISON:  None. FINDINGS: Status post median sternotomy. Left-sided transvenous pacemaker lead to the right ventricle. The heart is mildly enlarged. Right lung is clear. There is opacity at the posterior left lung base which obscures the posterior portion of the hemidiaphragm. Findings are consistent with atelectasis or infiltrate. No pneumothorax or acute rib fractures are identified. IMPRESSION: 1. Opacity at the left lung base consistent with atelectasis or more likely, infiltrate. Followup PA and lateral chest X-ray is recommended in 3-4 weeks following trial of antibiotic therapy to ensure resolution and exclude underlying malignancy. 2. Cardiomegaly without pulmonary edema. Electronically Signed   By: Norva PavlovElizabeth  Brown M.D.   On: 04/04/2016 00:05   Dg  Chest Port 1 View  Result Date: 04/05/2016 CLINICAL DATA:  Left-sided pleuritic chest pain radiating to back. EXAM: PORTABLE CHEST 1 VIEW COMPARISON:  04/03/2016 FINDINGS: Increased opacity is seen in the retrocardiac left lung base, likely representing a combination of pleural effusion and pulmonary infiltrate or atelectasis. Right lung is clear. Heart size is stable. Prior median sternotomy. Single lead transvenous pacemaker in appropriate position. IMPRESSION: Increased left lower lung infiltrate or atelectasis, and probable pleural effusion . Electronically Signed   By: Myles Rosenthal M.D.   On: 04/05/2016 18:23    EKG:  Anteroseptal infarction with Q waves V1 through V4. Biatrial abnormality. Sinus tachycardia. When compared to prior previous tracing, no significant changes noted from 03/30/16.    Lyn Records III 04/05/2016 6:47 PM

## 2016-04-05 NOTE — ED Notes (Signed)
Pt mother at nurses station, states pt is requesting pain medicine. Effie ShyWentz MD made aware. Will place order for pain medicine & fluids since elevated lactic. EDP also states he was not aware of elevated lactic. Charge RN has been made aware.

## 2016-04-05 NOTE — H&P (Signed)
History and Physical    William Rubio:[id number] DOB: [birth year] DOA: 04/05/2016  Referring MD/NP/PA: Dr. Effie Shy PCP: Default, Provider, MD  Patient coming from: Home  Chief Complaint: Fevers and pain  HPI: William Rubio is a 41 y.o. male with medical history significant of HLD, CAD s/p CABG, CHF, and s/p AICD;  who presents with complaints of intermittent fevers with chest and back pain. 2 days ago patient reports being evaluated in the emergency department and determined to have a left sided pneumonia. He was treated with Levaquin IV in the ED and sent home with oral Levaquin  Patient reports taking this medication as prescribed, but has had persistent fevers up to 101.3F at home. Also complaining of worsening left-sided chest and back pain.  Symptoms worsened with deep inspiratory breathing. Denies having a significant productive cough. Associated symptoms include shortness of breath and nausea. Denies having any leg swelling, recent travel, vomiting, constipation, diarrhea, dysuria, orthopnea, change in weight Patient previously had a CABG with AICD placement approximately 4 months ago.  ED Course: Upon admission into the emergency department patient was seen as a code STEMI. Vital signs revealed temperature up to 101.56F, pulse 98, respirations up to 30, blood pressure 131/81, saturations 95% on room air. The initial EKG showed some signs of possible ischemia in leads II, III, and aVF. Dr. Verdis Prime of cardiology evaluated the patient in the ED and thought symptoms were possibly related to pleurisy/possible empyema. Sepsis protocol was initiated with full fluid bolus, blood cultures were obtained, and the patient was started on vancomycin and aztreonam given his history of medication allergies.  Review of Systems: As per HPI otherwise 10 point review of systems negative.   Past Medical History:  Diagnosis Date  . AICD (automatic cardioverter/defibrillator) present 2018  . CHF  (congestive heart failure) (HCC)   . Coronary artery disease   . Hyperlipidemia     Past Surgical History:  Procedure Laterality Date  . CORONARY ARTERY BYPASS GRAFT    . CORONARY STENT PLACEMENT    . SPLENECTOMY, TOTAL       reports that he has been smoking E-cigarettes.  He has never used smokeless tobacco. He reports that he does not drink alcohol or use drugs.  Allergies  Allergen Reactions  . Other     Bee stings  . Penicillins     History reviewed. No pertinent family history.  Prior to Admission medications   Medication Sig Start Date End Date Taking? Authorizing Provider  aspirin EC 81 MG tablet Take 81 mg by mouth daily.    Historical Provider, MD  furosemide (LASIX) 20 MG tablet Take 20 mg by mouth daily. 02/06/16   Historical Provider, MD  levofloxacin (LEVAQUIN) 750 MG tablet Take 1 tablet (750 mg total) by mouth daily. X 7 days 04/04/16   Charlynne Pander, MD  lisinopril (PRINIVIL,ZESTRIL) 20 MG tablet Take 20 mg by mouth daily. 02/06/16   Historical Provider, MD  metoprolol (LOPRESSOR) 50 MG tablet Take 50 mg by mouth 2 (two) times daily. 02/06/16   Historical Provider, MD  omeprazole (PRILOSEC OTC) 20 MG tablet Take 20 mg by mouth daily.    Historical Provider, MD  pravastatin (PRAVACHOL) 80 MG tablet Take 80 mg by mouth daily. 02/09/16   Historical Provider, MD  traMADol (ULTRAM) 50 MG tablet Take 1 tablet (50 mg total) by mouth every 6 (six) hours as needed. 04/04/16   Charlynne Pander, MD    Physical Exam:  Constitutional:  Middle-aged  male who appears to be in moderate distress Vitals:   04/05/16 1830 04/05/16 1915 04/05/16 2015 04/05/16 2109  BP:      Pulse:      Resp: (!) 27 (!) 22 (!) 30   Temp:    100.3 F (37.9 C)  TempSrc:    Rectal  SpO2:       Eyes: PERRL, lids and conjunctivae normal ENMT: Mucous membranes are moist. Posterior pharynx clear of any exudate or lesions. Normal dentition.  Neck: normal, supple, no masses, no  thyromegaly Respiratory: Tachypneic clear to auscultation bilaterally, no wheezing, no crackles. No accessory muscle use.  Cardiovascular: Regular rate and rhythm, no murmurs / rubs / gallops. No extremity edema. 2+ pedal pulses. No carotid bruits.  Abdomen: no tenderness, no masses palpated. No hepatosplenomegaly. Bowel sounds positive.  Musculoskeletal: no clubbing / cyanosis. No joint deformity upper and lower extremities. Good ROM, no contractures. Normal muscle tone.  Skin: no rashes, lesions, ulcers. No induration Neurologic: CN 2-12 grossly intact. Sensation intact, DTR normal. Strength 5/5 in all 4.  Psychiatric: Normal judgment and insight. Alert and oriented x 3. Normal mood.     Labs on Admission: I have personally reviewed following labs and imaging studies  CBC:  Recent Labs Lab 04/04/16 0011 04/05/16 1746  WBC 18.9* 24.7*  NEUTROABS 12.1*  --   HGB 15.9 14.9  HCT 45.9 42.2  MCV 89.5 88.3  PLT 353 348   Basic Metabolic Panel:  Recent Labs Lab 04/04/16 0011 04/05/16 1746  NA 135 135  K 4.1 4.4  CL 97* 96*  CO2 26 25  GLUCOSE 108* 122*  BUN 13 11  CREATININE 0.93 1.00  CALCIUM 9.9 9.5   GFR: Estimated Creatinine Clearance: 125.1 mL/min (by C-G formula based on SCr of 1 mg/dL). Liver Function Tests:  Recent Labs Lab 04/04/16 0011  AST 18  ALT 21  ALKPHOS 64  BILITOT 0.8  PROT 7.6  ALBUMIN 4.5   No results for input(s): LIPASE, AMYLASE in the last 168 hours. No results for input(s): AMMONIA in the last 168 hours. Coagulation Profile: No results for input(s): INR, PROTIME in the last 168 hours. Cardiac Enzymes: No results for input(s): CKTOTAL, CKMB, CKMBINDEX, TROPONINI in the last 168 hours. BNP (last 3 results) No results for input(s): PROBNP in the last 8760 hours. HbA1C: No results for input(s): HGBA1C in the last 72 hours. CBG: No results for input(s): GLUCAP in the last 168 hours. Lipid Profile: No results for input(s): CHOL, HDL,  LDLCALC, TRIG, CHOLHDL, LDLDIRECT in the last 72 hours. Thyroid Function Tests: No results for input(s): TSH, T4TOTAL, FREET4, T3FREE, THYROIDAB in the last 72 hours. Anemia Panel: No results for input(s): VITAMINB12, FOLATE, FERRITIN, TIBC, IRON, RETICCTPCT in the last 72 hours. Urine analysis:    Component Value Date/Time   COLORURINE STRAW (A) 04/03/2016 2345   APPEARANCEUR CLEAR 04/03/2016 2345   LABSPEC 1.006 04/03/2016 2345   PHURINE 6.0 04/03/2016 2345   GLUCOSEU NEGATIVE 04/03/2016 2345   HGBUR NEGATIVE 04/03/2016 2345   BILIRUBINUR NEGATIVE 04/03/2016 2345   KETONESUR NEGATIVE 04/03/2016 2345   PROTEINUR NEGATIVE 04/03/2016 2345   NITRITE NEGATIVE 04/03/2016 2345   LEUKOCYTESUR NEGATIVE 04/03/2016 2345   Sepsis Labs: Recent Results (from the past 240 hour(s))  Blood culture (routine x 2)     Status: None (Preliminary result)   Collection Time: 04/04/16 12:05 AM  Result Value Ref Range Status   Specimen Description BLOOD RIGHT ANTECUBITAL  Final   Special  Requests BOTTLES DRAWN AEROBIC AND ANAEROBIC 5CC  Final   Culture NO GROWTH 1 DAY  Final   Report Status PENDING  Incomplete  Blood culture (routine x 2)     Status: None (Preliminary result)   Collection Time: 04/04/16 12:10 AM  Result Value Ref Range Status   Specimen Description BLOOD LEFT ANTECUBITAL  Final   Special Requests BOTTLES DRAWN AEROBIC AND ANAEROBIC 5CC  Final   Culture NO GROWTH 1 DAY  Final   Report Status PENDING  Incomplete     Radiological Exams on Admission: Dg Chest 2 View  Result Date: 04/04/2016 CLINICAL DATA:  Left side rib pain, fever, slight sob all today, HTN, ex-smoker. Hx CABG and defibrillator placement 10/2015 EXAM: CHEST  2 VIEW COMPARISON:  None. FINDINGS: Status post median sternotomy. Left-sided transvenous pacemaker lead to the right ventricle. The heart is mildly enlarged. Right lung is clear. There is opacity at the posterior left lung base which obscures the posterior portion  of the hemidiaphragm. Findings are consistent with atelectasis or infiltrate. No pneumothorax or acute rib fractures are identified. IMPRESSION: 1. Opacity at the left lung base consistent with atelectasis or more likely, infiltrate. Followup PA and lateral chest X-ray is recommended in 3-4 weeks following trial of antibiotic therapy to ensure resolution and exclude underlying malignancy. 2. Cardiomegaly without pulmonary edema. Electronically Signed   By: Norva PavlovElizabeth  Brown M.D.   On: 04/04/2016 00:05   Dg Chest Port 1 View  Result Date: 04/05/2016 CLINICAL DATA:  Left-sided pleuritic chest pain radiating to back. EXAM: PORTABLE CHEST 1 VIEW COMPARISON:  04/03/2016 FINDINGS: Increased opacity is seen in the retrocardiac left lung base, likely representing a combination of pleural effusion and pulmonary infiltrate or atelectasis. Right lung is clear. Heart size is stable. Prior median sternotomy. Single lead transvenous pacemaker in appropriate position. IMPRESSION: Increased left lower lung infiltrate or atelectasis, and probable pleural effusion . Electronically Signed   By: Myles RosenthalJohn  Stahl M.D.   On: 04/05/2016 18:23    EKG: Independently reviewed. Signs of ST elevations in II, III, and AVF   Assessment/Plan Sepsis 2/2 Community-acquired pneumonia: Acute. Patient previously treated for left-sided pneumonia with Levaquin however returns with persistent fevers of 101.29F in the ED, WBC 24.7, lactic acid 2.28 - Admit to telemetry bed - Follow-up blood and sputum studies - Continue antibiotics of vancomycin and aztreonam - Trend lactic acid level   Possible Pleurisy with effusion - Continuous pulse oximetry with nasal cannula oxygen as needed -  Add on D dimer  - Check Decubitus x-ray - Trend cardiac enzymes  - Cardiology consulted, will need to follow-up for further recommendations  CAD (coronary artery disease) of artery bypass graft  - Continue aspirin and statin  Chronic systolic heart failure  (HCC) - Strict I&Os and daily weights  - Continue Lasix, metoprolol, lisinopril   Hyperlipidemia - Continue pravastatin  Genella RifeGerd  - Pharmacy substitution of Protonix  DVT prophylaxis:   Lovenox Code Status: Full  Family Communication: Discussed plan of care with the patient and family present at bedside Disposition Plan:  Likely discharge home once medically stable Consults called: Cardiology Admission status: observation  Clydie Braunondell A Kaisei Gilbo MD [facility] Pager [phone]  If 7PM-7AM, please contact night-coverage [email or web address] Password [email or web address]  04/05/2016, 9:21 PM

## 2016-04-05 NOTE — ED Provider Notes (Signed)
[facility]-EMERGENCY DEPT Provider Note   CSN: [id number] Arrival date & time: 04/05/16  1735     History   Chief Complaint Chief Complaint  Patient presents with  . Chest Pain    HPI William Rubio is a 41 y.o. male.  He complains of change in his chest pain which has been present for several days, going from lower anterior left chest to mid anterior left chest.  This happened several hours ago.  He denies shortness of breath weakness or dizziness.  He states he had a repeat coronary artery bypass graft, procedure, with defibrillator placement, one year ago in [place].  He was seen here 2 days ago diagnosed with pneumonia.  He is taking antibiotic, without relief of his symptoms.  He takes baby aspirin in the morning.  He denies dyspnea, fever, nausea, vomiting, weakness or dizziness.  There are no other known modifying factors.  HPI  Past Medical History:  Diagnosis Date  . AICD (automatic cardioverter/defibrillator) present 2018  . CHF (congestive heart failure) (HCC)   . Coronary artery disease   . Hyperlipidemia     Patient Active Problem List   Diagnosis Date Noted  . CAD (coronary artery disease) of artery bypass graft 04/05/2016  . Chronic systolic heart failure (HCC) 04/05/2016  . LLL pneumonia (HCC) 04/05/2016  . Pleural effusion 04/05/2016  . Pleurisy with effusion 04/05/2016  . ST elevation myocardial infarction involving left circumflex coronary artery [facility])     Past Surgical History:  Procedure Laterality Date  . CORONARY ARTERY BYPASS GRAFT    . CORONARY STENT PLACEMENT    . SPLENECTOMY, TOTAL         Home Medications    Prior to Admission medications   Medication Sig Start Date End Date Taking? Authorizing Provider  aspirin EC 81 MG tablet Take 81 mg by mouth daily.    Historical Provider, MD  furosemide (LASIX) 20 MG tablet Take 20 mg by mouth daily. 02/06/16   Historical Provider, MD  levofloxacin (LEVAQUIN) 750 MG tablet Take 1 tablet (750  mg total) by mouth daily. X 7 days 04/04/16   Charlynne Pander, MD  lisinopril (PRINIVIL,ZESTRIL) 20 MG tablet Take 20 mg by mouth daily. 02/06/16   Historical Provider, MD  metoprolol (LOPRESSOR) 50 MG tablet Take 50 mg by mouth 2 (two) times daily. 02/06/16   Historical Provider, MD  omeprazole (PRILOSEC OTC) 20 MG tablet Take 20 mg by mouth daily.    Historical Provider, MD  pravastatin (PRAVACHOL) 80 MG tablet Take 80 mg by mouth daily. 02/09/16   Historical Provider, MD  traMADol (ULTRAM) 50 MG tablet Take 1 tablet (50 mg total) by mouth every 6 (six) hours as needed. 04/04/16   Charlynne Pander, MD    Family History History reviewed. No pertinent family history.  Social History Social History  Substance Use Topics  . Smoking status: Current Every Day Smoker    Types: E-cigarettes  . Smokeless tobacco: Never Used  . Alcohol use No     Allergies   Other and Penicillins   Review of Systems Review of Systems  All other systems reviewed and are negative.    Physical Exam Updated Vital Signs BP 131/78 (BP Location: Left Arm)   Pulse 98   Temp (!) 100.4 F (38 C) (Oral)   Resp (!) 22   SpO2 95%   Physical Exam  Constitutional: He is oriented to person, place, and time. He appears well-developed. No distress.  Obese  HENT:  Head: Normocephalic and atraumatic.  Right Ear: External ear normal.  Left Ear: External ear normal.  Eyes: Conjunctivae and EOM are normal. Pupils are equal, round, and reactive to light.  Neck: Normal range of motion and phonation normal. Neck supple.  Cardiovascular: Normal rate, regular rhythm and normal heart sounds.   Pulmonary/Chest: Effort normal and breath sounds normal. No respiratory distress. He has no wheezes. He has no rales. He exhibits no bony tenderness.  Abdominal: Soft. There is no tenderness.  Musculoskeletal: Normal range of motion. He exhibits no edema or tenderness.  Neurological: He is alert and oriented to person, place, and  time. No cranial nerve deficit or sensory deficit. He exhibits normal muscle tone. Coordination normal.  Skin: Skin is warm, dry and intact.  Psychiatric: He has a normal mood and affect. His behavior is normal. Judgment and thought content normal.  Nursing note and vitals reviewed.    ED Treatments / Results  Labs (all labs ordered are listed, but only abnormal results are displayed) Labs Reviewed  BASIC METABOLIC PANEL - Abnormal; Notable for the following:       Result Value   Chloride 96 (*)    Glucose, Bld 122 (*)    All other components within normal limits  CBC - Abnormal; Notable for the following:    WBC 24.7 (*)    All other components within normal limits  I-STAT CG4 LACTIC ACID, ED - Abnormal; Notable for the following:    Lactic Acid, Venous 2.28 (*)    All other components within normal limits  CULTURE, BLOOD (ROUTINE X 2)  CULTURE, BLOOD (ROUTINE X 2)  I-STAT TROPOININ, ED    EKG  EKG Interpretation  Date/Time:  Monday April 05 2016 17:50:08 EDT Ventricular Rate:  99 PR Interval:  204 QRS Duration: 96 QT Interval:  342 QTC Calculation: 439 R Axis:   39 Text Interpretation:  Sinus rhythm Borderline prolonged PR interval Probable left atrial enlargement Inferior infarct, acute (LCx) Probable anterolateral infarct, recent Baseline wander in lead(s) V3 Since last tracing of earlier today perdistant ST elevation II, III, and aVF, c/w STEMI Confirmed by [facility]  MD, Arval Brandstetter [phone]([id number]) on 04/05/2016 5:54:14 PM       EKG Interpretation  Date/Time:  Monday April 05 2016 17:50:08 EDT Ventricular Rate:  99 PR Interval:  204 QRS Duration: 96 QT Interval:  342 QTC Calculation: 439 R Axis:   39 Text Interpretation:  Sinus rhythm Borderline prolonged PR interval Probable left atrial enlargement Inferior infarct, acute (LCx) Probable anterolateral infarct, recent Baseline wander in lead(s) V3 Since last tracing of earlier today perdistant ST elevation II, III, and aVF, c/w  STEMI Confirmed by [facility]  MD, Evoleth Nordmeyer ([id number]) on 04/05/2016 5:54:14 PM       Radiology Dg Chest 2 View  Result Date: 04/04/2016 CLINICAL DATA:  Left side rib pain, fever, slight sob all today, HTN, ex-smoker. Hx CABG and defibrillator placement 10/2015 EXAM: CHEST  2 VIEW COMPARISON:  None. FINDINGS: Status post median sternotomy. Left-sided transvenous pacemaker lead to the right ventricle. The heart is mildly enlarged. Right lung is clear. There is opacity at the posterior left lung base which obscures the posterior portion of the hemidiaphragm. Findings are consistent with atelectasis or infiltrate. No pneumothorax or acute rib fractures are identified. IMPRESSION: 1. Opacity at the left lung base consistent with atelectasis or more likely, infiltrate. Followup PA and lateral chest X-ray is recommended in 3-4 weeks following trial of antibiotic therapy to ensure  resolution and exclude underlying malignancy. 2. Cardiomegaly without pulmonary edema. Electronically Signed   By: Norva Pavlov M.D.   On: 04/04/2016 00:05   Dg Chest Port 1 View  Result Date: 04/05/2016 CLINICAL DATA:  Left-sided pleuritic chest pain radiating to back. EXAM: PORTABLE CHEST 1 VIEW COMPARISON:  04/03/2016 FINDINGS: Increased opacity is seen in the retrocardiac left lung base, likely representing a combination of pleural effusion and pulmonary infiltrate or atelectasis. Right lung is clear. Heart size is stable. Prior median sternotomy. Single lead transvenous pacemaker in appropriate position. IMPRESSION: Increased left lower lung infiltrate or atelectasis, and probable pleural effusion . Electronically Signed   By: Myles Rosenthal M.D.   On: 04/05/2016 18:23    Procedures Procedures (including critical care time)  Medications Ordered in ED Medications  sodium chloride 0.9 % bolus 1,000 mL (not administered)    And  sodium chloride 0.9 % bolus 1,000 mL (not administered)    And  sodium chloride 0.9 % bolus 1,000 mL  (not administered)    And  sodium chloride 0.9 % bolus 1,000 mL (not administered)  HYDROmorphone (DILAUDID) injection 1 mg (not administered)  ondansetron (ZOFRAN) injection 4 mg (not administered)  heparin 5000 UNIT/ML injection (4,000 Units  Given 04/05/16 1804)  aspirin 81 MG chewable tablet (324 mg  Given 04/05/16 1804)     Initial Impression / Assessment and Plan / ED Course  I have reviewed the triage vital signs and the nursing notes.  Pertinent labs & imaging results that were available during my care of the patient were reviewed by me and considered in my medical decision making (see chart for details).  Clinical Course as of Apr 06 2046  Mon Apr 05, 2016  1745 Code STEMI page requested by me at triage  [EW]  1841 Case discussed with cardiology, Dr. Katrinka Blazing, who feels that this case does not have findings consistent with STEMI, therefore will not take him to the cardiac catheterization lab at this time.  He will remain involved as a Research scientist (medical) and suggests further evaluation.  He feels that the patient has a component of pleuritic chest pain  [EW]  2046 Arlana Pouch returned elevated, findings are consistent with acute infectious process/left lower lobe pneumonia with effusion.  Patient will be treated with high volume saline bolus, started on broad-spectrum antibiotics, and admitted to telemetry unit.  [EW]    Clinical Course User Index [EW] Mancel Bale, MD    Medications  sodium chloride 0.9 % bolus 1,000 mL (not administered)    And  sodium chloride 0.9 % bolus 1,000 mL (not administered)    And  sodium chloride 0.9 % bolus 1,000 mL (not administered)    And  sodium chloride 0.9 % bolus 1,000 mL (not administered)  HYDROmorphone (DILAUDID) injection 1 mg (not administered)  ondansetron (ZOFRAN) injection 4 mg (not administered)  heparin 5000 UNIT/ML injection (4,000 Units  Given 04/05/16 1804)  aspirin 81 MG chewable tablet (324 mg  Given 04/05/16 1804)    Patient Vitals for  the past 24 hrs:  BP Temp Temp src Pulse Resp SpO2  04/05/16 1915 - - - - (!) 22 -  04/05/16 1830 - - - - (!) 27 -  04/05/16 1746 - (!) 100.4 F (38 C) Oral - - -  04/05/16 1742 131/78 - Oral 98 18 95 %    8:47 PM Reevaluation with update and discussion. After initial assessment and treatment, an updated evaluation reveals he continues to pain of left-sided chest  pain, narcotic analgesia ordered.Mancel Bale L   8:51 PM-Consult complete with hospitalist. Patient case explained and discussed.  He agrees to admit patient for further evaluation and treatment. Call ended at 21: 21  CRITICAL CARE Performed by: Mancel Bale L Total critical care time: 45 minutes Critical care time was exclusive of separately billable procedures and treating other patients. Critical care was necessary to treat or prevent imminent or life-threatening deterioration. Critical care was time spent personally by me on the following activities: development of treatment plan with patient and/or surrogate as well as nursing, discussions with consultants, evaluation of patient's response to treatment, examination of patient, obtaining history from patient or surrogate, ordering and performing treatments and interventions, ordering and review of laboratory studies, ordering and review of radiographic studies, pulse oximetry and re-evaluation of patient's condition.  Final Clinical Impressions(s) / ED Diagnoses   Final diagnoses:  Community acquired pneumonia of left lower lobe of lung [facility])   Patient presents for evaluation of left-sided chest pain, which is changing somewhat in nature and is associated with current ongoing treatment for pneumonia.  On presentation his pain had changed somewhat from baseline, and his EKG was worrisome for acute infarct.  The patient has been seen by cardiology who feels that he does not have a ST segment elevation MI.  His pain was felt to be pleuritic, which would be consistent with  presence of x-ray findings of pneumonia with effusion.  He had a relatively recent defibrillator placement, raising possibility of device infection.  Patient has a mildly elevated lactate, and white blood cell count of 24,000, high.  He is hemodynamically stable.  He will require admission for monitoring and further treatment.  Cardiology remains as a Research scientist (medical).  Nursing Notes Reviewed/ Care Coordinated Applicable Imaging Reviewed Interpretation of Laboratory Data incorporated into ED treatment  Plan: Admit   New Prescriptions New Prescriptions   No medications on file     Mancel Bale, MD 04/05/16 2123

## 2016-04-05 NOTE — ED Notes (Signed)
Cardiology at bedside.

## 2016-04-05 NOTE — ED Triage Notes (Signed)
Pt reports he is having constant left sided chest pain worse with inspiration that radiates to his back. Hx of pacemaker/ICD placed in October 2017.

## 2016-04-05 NOTE — Progress Notes (Signed)
Pharmacy Antibiotic Note Corky MullJoseph Popoca is a 41 y.o. male admitted on 04/05/2016 with LLL PNA.  Pharmacy has been consulted for Azactam and vancomycin dosing.  Plan: 1. Azactam 2 gram IV every 8 hours 2. Vancomycin 2000 mg IV x 1 now followed by vancomycin 1500 mg IV every 12 hours starting on 3/27     Temp (24hrs), Avg:100.4 F (38 C), Min:100.4 F (38 C), Max:100.4 F (38 C)   Recent Labs Lab 04/04/16 0011 04/04/16 0017 04/05/16 1746 04/05/16 2005  WBC 18.9*  --  24.7*  --   CREATININE 0.93  --  1.00  --   LATICACIDVEN  --  1.39  --  2.28*    Estimated Creatinine Clearance: 125.1 mL/min (by C-G formula based on SCr of 1 mg/dL).    Allergies  Allergen Reactions  . Other     Bee stings  . Penicillins     Antimicrobials this admission: 3/26 Azactam >>  3/26 vancomycin  >>    Microbiology results: 3/26 BCx: px 3/25 BCx: ngtd  Pollyann SamplesAndy Aries Kasa, PharmD, BCPS 04/05/2016, 9:01 PM

## 2016-04-06 ENCOUNTER — Observation Stay (HOSPITAL_COMMUNITY): Payer: BLUE CROSS/BLUE SHIELD

## 2016-04-06 DIAGNOSIS — Z91030 Bee allergy status: Secondary | ICD-10-CM | POA: Diagnosis not present

## 2016-04-06 DIAGNOSIS — Z6838 Body mass index (BMI) 38.0-38.9, adult: Secondary | ICD-10-CM | POA: Diagnosis not present

## 2016-04-06 DIAGNOSIS — I248 Other forms of acute ischemic heart disease: Secondary | ICD-10-CM

## 2016-04-06 DIAGNOSIS — K219 Gastro-esophageal reflux disease without esophagitis: Secondary | ICD-10-CM | POA: Diagnosis present

## 2016-04-06 DIAGNOSIS — R0602 Shortness of breath: Secondary | ICD-10-CM | POA: Diagnosis not present

## 2016-04-06 DIAGNOSIS — E872 Acidosis: Secondary | ICD-10-CM | POA: Diagnosis present

## 2016-04-06 DIAGNOSIS — E669 Obesity, unspecified: Secondary | ICD-10-CM | POA: Diagnosis present

## 2016-04-06 DIAGNOSIS — I25700 Atherosclerosis of coronary artery bypass graft(s), unspecified, with unstable angina pectoris: Secondary | ICD-10-CM

## 2016-04-06 DIAGNOSIS — J189 Pneumonia, unspecified organism: Secondary | ICD-10-CM | POA: Diagnosis present

## 2016-04-06 DIAGNOSIS — J90 Pleural effusion, not elsewhere classified: Secondary | ICD-10-CM | POA: Diagnosis present

## 2016-04-06 DIAGNOSIS — I255 Ischemic cardiomyopathy: Secondary | ICD-10-CM | POA: Diagnosis present

## 2016-04-06 DIAGNOSIS — I2510 Atherosclerotic heart disease of native coronary artery without angina pectoris: Secondary | ICD-10-CM | POA: Diagnosis present

## 2016-04-06 DIAGNOSIS — Z95810 Presence of automatic (implantable) cardiac defibrillator: Secondary | ICD-10-CM | POA: Diagnosis not present

## 2016-04-06 DIAGNOSIS — Z7982 Long term (current) use of aspirin: Secondary | ICD-10-CM | POA: Diagnosis not present

## 2016-04-06 DIAGNOSIS — R509 Fever, unspecified: Secondary | ICD-10-CM | POA: Diagnosis present

## 2016-04-06 DIAGNOSIS — I5022 Chronic systolic (congestive) heart failure: Secondary | ICD-10-CM | POA: Diagnosis present

## 2016-04-06 DIAGNOSIS — A419 Sepsis, unspecified organism: Secondary | ICD-10-CM | POA: Diagnosis present

## 2016-04-06 DIAGNOSIS — Z955 Presence of coronary angioplasty implant and graft: Secondary | ICD-10-CM | POA: Diagnosis not present

## 2016-04-06 DIAGNOSIS — Z9889 Other specified postprocedural states: Secondary | ICD-10-CM | POA: Diagnosis not present

## 2016-04-06 DIAGNOSIS — Z79899 Other long term (current) drug therapy: Secondary | ICD-10-CM | POA: Diagnosis not present

## 2016-04-06 DIAGNOSIS — J181 Lobar pneumonia, unspecified organism: Secondary | ICD-10-CM | POA: Diagnosis not present

## 2016-04-06 DIAGNOSIS — Z880 Allergy status to penicillin: Secondary | ICD-10-CM | POA: Diagnosis not present

## 2016-04-06 DIAGNOSIS — Z9081 Acquired absence of spleen: Secondary | ICD-10-CM | POA: Diagnosis not present

## 2016-04-06 DIAGNOSIS — E785 Hyperlipidemia, unspecified: Secondary | ICD-10-CM | POA: Diagnosis present

## 2016-04-06 DIAGNOSIS — F17290 Nicotine dependence, other tobacco product, uncomplicated: Secondary | ICD-10-CM | POA: Diagnosis present

## 2016-04-06 DIAGNOSIS — I25810 Atherosclerosis of coronary artery bypass graft(s) without angina pectoris: Secondary | ICD-10-CM | POA: Diagnosis present

## 2016-04-06 LAB — PROCALCITONIN: Procalcitonin: <0.1 ng/mL

## 2016-04-06 LAB — D-DIMER, QUANTITATIVE: D-Dimer, Quant: 3.15 ug/mL-FEU — ABNORMAL HIGH (ref 0.00–0.50)

## 2016-04-06 LAB — BODY FLUID CELL COUNT WITH DIFFERENTIAL
LYMPHS FL: 3 %
MONOCYTE-MACROPHAGE-SEROUS FLUID: 23 % — AB (ref 50–90)
NEUTROPHIL FLUID: 74 % — AB (ref 0–25)
WBC FLUID: 9150 uL — AB (ref 0–1000)

## 2016-04-06 LAB — PROTIME-INR
INR: 1.16
PROTHROMBIN TIME: 14.8 s (ref 11.4–15.2)

## 2016-04-06 LAB — TROPONIN I
TROPONIN I: 0.08 ng/mL — AB (ref <0.03)
TROPONIN I: 0.11 ng/mL — AB (ref <0.03)
TROPONIN I: 0.07 ng/mL — AB (ref <0.03)

## 2016-04-06 LAB — BRAIN NATRIURETIC PEPTIDE: B Natriuretic Peptide: 203.6 pg/mL — ABNORMAL HIGH (ref 0.0–100.0)

## 2016-04-06 LAB — BASIC METABOLIC PANEL
ANION GAP: 11 (ref 5–15)
BUN: 6 mg/dL (ref 6–20)
CHLORIDE: 101 mmol/L (ref 101–111)
CO2: 24 mmol/L (ref 22–32)
Calcium: 8.6 mg/dL — ABNORMAL LOW (ref 8.9–10.3)
Creatinine, Ser: 0.78 mg/dL (ref 0.61–1.24)
GFR calc Af Amer: >60 mL/min (ref >60)
GFR calc non Af Amer: >60 mL/min (ref >60)
Glucose, Bld: 102 mg/dL — ABNORMAL HIGH (ref 65–99)
Potassium: 3.8 mmol/L (ref 3.5–5.1)
Sodium: 136 mmol/L (ref 135–145)

## 2016-04-06 LAB — CBC WITH DIFFERENTIAL/PLATELET
BASOS ABS: 0.1 10*3/uL (ref 0.0–0.1)
BASOS PCT: 0 %
EOS PCT: 1 %
Eosinophils Absolute: 0.2 10*3/uL (ref 0.0–0.7)
HEMATOCRIT: 38.8 % — AB (ref 39.0–52.0)
Hemoglobin: 13.3 g/dL (ref 13.0–17.0)
LYMPHS PCT: 27 %
Lymphs Abs: 5.6 10*3/uL — ABNORMAL HIGH (ref 0.7–4.0)
MCH: 30.7 pg (ref 26.0–34.0)
MCHC: 34.3 g/dL (ref 30.0–36.0)
MCV: 89.6 fL (ref 78.0–100.0)
Monocytes Absolute: 3.7 10*3/uL — ABNORMAL HIGH (ref 0.1–1.0)
Monocytes Relative: 18 %
NEUTROS ABS: 11.4 10*3/uL — AB (ref 1.7–7.7)
Neutrophils Relative %: 54 %
PLATELETS: 361 10*3/uL (ref 150–400)
RBC: 4.33 MIL/uL (ref 4.22–5.81)
RDW: 14.9 % (ref 11.5–15.5)
WBC: 20.9 10*3/uL — AB (ref 4.0–10.5)

## 2016-04-06 LAB — LACTIC ACID, PLASMA
Lactic Acid, Venous: 1.9 mmol/L (ref 0.5–1.9)
Lactic Acid, Venous: 1.4 mmol/L (ref 0.5–1.9)

## 2016-04-06 LAB — GRAM STAIN

## 2016-04-06 LAB — GLUCOSE, PLEURAL OR PERITONEAL FLUID: Glucose, Fluid: 118 mg/dL

## 2016-04-06 LAB — STREP PNEUMONIAE URINARY ANTIGEN: Strep Pneumo Urinary Antigen: NEGATIVE

## 2016-04-06 LAB — ALBUMIN, PLEURAL OR PERITONEAL FLUID: Albumin, Fluid: 2.7 g/dL

## 2016-04-06 LAB — LACTATE DEHYDROGENASE: LDH: 148 U/L (ref 98–192)

## 2016-04-06 LAB — PROTEIN, TOTAL: Total Protein: 6.8 g/dL (ref 6.5–8.1)

## 2016-04-06 LAB — ALBUMIN: ALBUMIN: 3.4 g/dL — AB (ref 3.5–5.0)

## 2016-04-06 MED ORDER — IOPAMIDOL (ISOVUE-370) INJECTION 76%
INTRAVENOUS | Status: AC
  Administered 2016-04-06: 100 mL
  Filled 2016-04-06: qty 100

## 2016-04-06 MED ORDER — LIDOCAINE HCL 1 % IJ SOLN
INTRAMUSCULAR | Status: AC
  Filled 2016-04-06: qty 20

## 2016-04-06 MED ORDER — SODIUM CHLORIDE 0.9 % IV SOLN
INTRAVENOUS | Status: DC
  Administered 2016-04-06 (×2): via INTRAVENOUS

## 2016-04-06 MED ORDER — DEXTROSE 5 % IV SOLN
1.0000 g | Freq: Three times a day (TID) | INTRAVENOUS | Status: DC
  Administered 2016-04-06 – 2016-04-08 (×5): 1 g via INTRAVENOUS
  Filled 2016-04-06 (×6): qty 1

## 2016-04-06 MED ORDER — ACETAMINOPHEN 325 MG PO TABS
650.0000 mg | ORAL_TABLET | Freq: Once | ORAL | Status: AC
  Administered 2016-04-06: 650 mg via ORAL
  Filled 2016-04-06: qty 2

## 2016-04-06 NOTE — Procedures (Signed)
PROCEDURE SUMMARY:  Successful US guided diagnostic and therapeutic left thoracentesis. Yielded 120 mL of yellow, hazy fluid. Pt tolerated procedure well. No immediate complications.  Specimen was sent for labs. CXR ordered.  Hoyt KochKacie Sue-Ellen Melrose Kearse PA-C 04/06/2016 4:30 PM

## 2016-04-06 NOTE — Progress Notes (Signed)
Pt's second troponin up to 0.11.  Hospitalist was informed and instructed to inform cardiology about pt's increasing troponin levels.  Cardiology has been informed.  Will continue to monitor pt.  Harriet Massonavidson, Kairi Harshbarger E, RN

## 2016-04-06 NOTE — Progress Notes (Signed)
PROGRESS NOTE                                                                                                                                                                                                             Patient Demographics:    William Rubio, is a 41 y.o. male, DOB - 02/05/1975, UJW:[id number]  Admit date - 04/05/2016   Admitting Physician Clydie Braun, MD  Outpatient Primary MD for the patient is Default, Provider, MD  LOS - 0  Chief Complaint  Patient presents with  . Chest Pain       Brief Narrative  William Rubio is a 41 y.o. male with medical history significant of HLD, CAD s/p CABG, CHF, and s/p AICD;  who presents with complaints of intermittent fevers with chest and back pain. 2 days ago patient reports being evaluated in the emergency department and determined to have a left sided pneumonia, and possible left-sided parapneumonic effusion on CT angiogram of the chest.   Subjective:    William Rubio today has, No headache, Does have left-sided pleuritic chest pain, No abdominal pain - No Nausea, No new weakness tingling or numbness,+ve  Cough &SOB.     Assessment  & Plan :    1.Sepsis due to left lower lobe pneumonia along with possible parapneumonic effusion. Patient was febrile and septic upon admission, apparently he had taken oral antibiotics for a few days without much benefit. He is currently been placed on appropriate IV antibiotics which include vancomycin and aztreonam. Follow cultures. Sepsis physiology has resolved. Since he was having pleuritic chest pain with ongoing fevers will have IR do ultrasound-guided left thoracentesis. Appropriate labs ordered.  2. History of CAD, chronic systolic CHF. He is status post CABG and AICD placement in [place]. He has not seen a cardiologist locally increase water where he moved recently, I'll troponin elevation is likely demand ischemia  from pneumonia and sepsis above, currently on combination of aspirin, beta blocker and ACE inhibitor along with statin for secondary prevention this will be continued. Compensated from CHF standpoint continue home dose Lasix and stop further IV fluids. Check baseline echocardiogram. Cardiology on board.  3. Dyslipidemia. Continue statin.  4. GERD. On PPI.    Diet : Diet Heart Room service appropriate? Yes; Fluid consistency: Thin; Fluid restriction: 1500 mL Fluid  Family Communication  : None  Code Status :  Full  Disposition Plan  :  TBD  Consults  :  Cards  Procedures  :    CTA - Left-sided infiltrate and moderate left pleural effusion. No PE.  Echocardiogram -   Ultrasound-guided left-sided thoracentesis ordered  DVT Prophylaxis  :  Lovenox    Lab Results  Component Value Date   PLT 361 04/06/2016    Inpatient Medications  Scheduled Meds: . aspirin EC  81 mg Oral Daily  . aztreonam  2 g Intravenous Q8H  . enoxaparin (LOVENOX) injection  40 mg Subcutaneous Q2200  . furosemide  20 mg Oral Daily  . guaiFENesin  600 mg Oral BID  . lisinopril  20 mg Oral Daily  . metoprolol  50 mg Oral BID  . pantoprazole  40 mg Oral Daily  . pravastatin  80 mg Oral Daily  . vancomycin  1,500 mg Intravenous Q12H   Continuous Infusions: PRN Meds:.acetaminophen, gi cocktail, ipratropium-albuterol, morphine injection, ondansetron (ZOFRAN) IV  Antibiotics  :    Anti-infectives    Start     Dose/Rate Route Frequency Ordered Stop   04/06/16 1000  vancomycin (VANCOCIN) 1,500 mg in sodium chloride 0.9 % 500 mL IVPB     1,500 mg 250 mL/hr over 120 Minutes Intravenous Every 12 hours 04/05/16 2058     04/05/16 2100  aztreonam (AZACTAM) 1 g in dextrose 5 % 50 mL IVPB  Status:  Discontinued     1 g 100 mL/hr over 30 Minutes Intravenous Every 8 hours 04/05/16 2049 04/05/16 2054   04/05/16 2100  vancomycin (VANCOCIN) 2,000 mg in sodium chloride 0.9 % 500 mL IVPB     2,000 mg 250 mL/hr  over 120 Minutes Intravenous  Once 04/05/16 2049 04/05/16 2350   04/05/16 2100  aztreonam (AZACTAM) 2 g in dextrose 5 % 50 mL IVPB     2 g 100 mL/hr over 30 Minutes Intravenous Every 8 hours 04/05/16 2054           Objective:   Vitals:   04/05/16 2301 04/05/16 2324 04/06/16 0310 04/06/16 0435  BP: 135/78 133/66 109/67   Pulse: (!) 106 (!) 106 94   Resp: (!) 21 20 17    Temp:  98.9 F (37.2 C) 99 F (37.2 C) 99.5 F (37.5 C)  TempSrc:  Oral Oral Oral  SpO2: 94% 94% 95%   Weight:  122.5 kg (270 lb 1.6 oz)    Height:  5\' 10"  (1.778 m)      Wt Readings from Last 3 Encounters:  04/05/16 122.5 kg (270 lb 1.6 oz)  04/03/16 120.3 kg (265 lb 4 oz)     Intake/Output Summary (Last 24 hours) at 04/06/16 0942 Last data filed at 04/06/16 0700  Gross per 24 hour  Intake             6747 ml  Output             2665 ml  Net             4082 ml     Physical Exam  Awake Alert, Oriented X 3, No new F.N deficits, Normal affect [place].AT,PERRAL Supple Neck,No JVD, No cervical lymphadenopathy appriciated.  Symmetrical Chest wall movement, LLL rales RRR,No Gallops,Rubs or new Murmurs, No Parasternal Heave +ve B.Sounds, Abd Soft, No tenderness, No organomegaly appriciated, No rebound - guarding or rigidity. No Cyanosis, Clubbing or edema, No new Rash or bruise     Data  Review:    CBC  Recent Labs Lab 04/04/16 0011 04/05/16 1746 04/06/16 0315  WBC 18.9* 24.7* 20.9*  HGB 15.9 14.9 13.3  HCT 45.9 42.2 38.8*  PLT 353 348 361  MCV 89.5 88.3 89.6  MCH 31.0 31.2 30.7  MCHC 34.6 35.3 34.3  RDW 14.7 14.3 14.9  LYMPHSABS 4.2*  --  5.6*  MONOABS 2.6*  --  3.7*  EOSABS 0.0  --  0.2  BASOSABS 0.0  --  0.1    Chemistries   Recent Labs Lab 04/04/16 0011 04/05/16 1746 04/06/16 0315  NA 135 135 136  K 4.1 4.4 3.8  CL 97* 96* 101  CO2 26 25 24   GLUCOSE 108* 122* 102*  BUN 13 11 6   CREATININE 0.93 1.00 0.78  CALCIUM 9.9 9.5 8.6*  AST 18  --   --   ALT 21  --   --   ALKPHOS  64  --   --   BILITOT 0.8  --   --    ------------------------------------------------------------------------------------------------------------------ No results for input(s): CHOL, HDL, LDLCALC, TRIG, CHOLHDL, LDLDIRECT in the last 72 hours.  No results found for: HGBA1C ------------------------------------------------------------------------------------------------------------------ No results for input(s): TSH, T4TOTAL, T3FREE, THYROIDAB in the last 72 hours.  Invalid input(s): FREET3 ------------------------------------------------------------------------------------------------------------------ No results for input(s): VITAMINB12, FOLATE, FERRITIN, TIBC, IRON, RETICCTPCT in the last 72 hours.  Coagulation profile No results for input(s): INR, PROTIME in the last 168 hours.   Recent Labs  04/05/16 2317  DDIMER 3.15*    Cardiac Enzymes  Recent Labs Lab 04/05/16 2317 04/06/16 0315  TROPONINI 0.08* 0.11*   ------------------------------------------------------------------------------------------------------------------ No results found for: BNP  Micro Results Recent Results (from the past 240 hour(s))  Blood culture (routine x 2)     Status: None (Preliminary result)   Collection Time: 04/04/16 12:05 AM  Result Value Ref Range Status   Specimen Description BLOOD RIGHT ANTECUBITAL  Final   Special Requests BOTTLES DRAWN AEROBIC AND ANAEROBIC 5CC  Final   Culture NO GROWTH 1 DAY  Final   Report Status PENDING  Incomplete  Blood culture (routine x 2)     Status: None (Preliminary result)   Collection Time: 04/04/16 12:10 AM  Result Value Ref Range Status   Specimen Description BLOOD LEFT ANTECUBITAL  Final   Special Requests BOTTLES DRAWN AEROBIC AND ANAEROBIC 5CC  Final   Culture NO GROWTH 1 DAY  Final   Report Status PENDING  Incomplete    Radiology Reports Dg Chest 2 View  Result Date: 04/04/2016 CLINICAL DATA:  Left side rib pain, fever, slight sob all  today, HTN, ex-smoker. Hx CABG and defibrillator placement 10/2015 EXAM: CHEST  2 VIEW COMPARISON:  None. FINDINGS: Status post median sternotomy. Left-sided transvenous pacemaker lead to the right ventricle. The heart is mildly enlarged. Right lung is clear. There is opacity at the posterior left lung base which obscures the posterior portion of the hemidiaphragm. Findings are consistent with atelectasis or infiltrate. No pneumothorax or acute rib fractures are identified. IMPRESSION: 1. Opacity at the left lung base consistent with atelectasis or more likely, infiltrate. Followup PA and lateral chest X-ray is recommended in 3-4 weeks following trial of antibiotic therapy to ensure resolution and exclude underlying malignancy. 2. Cardiomegaly without pulmonary edema. Electronically Signed   By: Norva Pavlov M.D.   On: 04/04/2016 00:05   Ct Angio Chest Pe W Or Wo Contrast  Result Date: 04/06/2016 CLINICAL DATA:  Chest pain. EXAM: CT ANGIOGRAPHY CHEST WITH CONTRAST TECHNIQUE: Multidetector  CT imaging of the chest was performed using the standard protocol during bolus administration of intravenous contrast. Multiplanar CT image reconstructions and MIPs were obtained to evaluate the vascular anatomy. CONTRAST:  79 cc Isovue 370 IV COMPARISON:  Radiographs yesterday as well as 04/03/2016 FINDINGS: Cardiovascular: Technically limited lower lobe evaluation due to breathing motion and contrast bolus timing. Allowing for this, no filling defects in the pulmonary arteries to suggest pulmonary embolus. Post CABG with calcification of the native coronary arteries. Multi chamber cardiomegaly. Pacemaker with tip about the right ventricle. Thoracic aorta is normal in caliber. Mediastinum/Nodes: Small pericardial effusion/ pericardial thickening. Enlarged prevascular node measures 14 mm. Small paratracheal nodes. No right hilar adenopathy. Left hilum partially obscured by adjacent left lung consolidation. The esophagus is  decompressed. Lungs/Pleura: Left lower lobe consolidation and small-moderate left pleural effusion. Additional patchy opacities in the perihilar left upper lobe. Minimal linear atelectasis in the right lung. No right pleural effusion. Upper Abdomen: No acute abnormality.  Post splenectomy. Musculoskeletal: Degenerative change in the lower thoracic spine. Post median sternotomy. There are no acute or suspicious osseous abnormalities. Review of the MIP images confirms the above findings. IMPRESSION: 1. No pulmonary embolus, allowing for limited assessment of the lower lobe pulmonary pulmonary arteries. 2. Left lower lobe consolidation likely pneumonia with small to moderate left pleural effusion. 3. Multi chamber cardiomegaly, post CABG. Small pericardial effusion/pericardial thickening. Electronically Signed   By: Rubye Oaks M.D.   On: 04/06/2016 05:38   Dg Chest Port 1 View  Result Date: 04/05/2016 CLINICAL DATA:  Left-sided pleuritic chest pain radiating to back. EXAM: PORTABLE CHEST 1 VIEW COMPARISON:  04/03/2016 FINDINGS: Increased opacity is seen in the retrocardiac left lung base, likely representing a combination of pleural effusion and pulmonary infiltrate or atelectasis. Right lung is clear. Heart size is stable. Prior median sternotomy. Single lead transvenous pacemaker in appropriate position. IMPRESSION: Increased left lower lung infiltrate or atelectasis, and probable pleural effusion . Electronically Signed   By: Myles Rosenthal M.D.   On: 04/05/2016 18:23    Time Spent in minutes  30   Addelynn Batte K M.D on 04/06/2016 at 9:42 AM  Between 7am to 7pm - Pager - [phone] ( page via [facility], text pages only, please mention full 10 digit call back number).  After 7pm go to [email or web address] - password [facility]  [facility] -  Office  [phone]

## 2016-04-06 NOTE — Progress Notes (Signed)
CRITICAL VALUE ALERT  Critical value received:  Troponin 0.08  Date of notification:  04/06/2016  Time of notification:  0149  Critical value read back: Yes  Nurse who received alert:  Lazaro ArmsKari Aseem Sessums, RN  MD notified (1st page):  Kirtland BouchardK. Schorr  Time of first page:  [phone]  MD notified (2nd page):  Time of second page:  Responding MD:  Madelyn Flavorsondell Smith  Time MD responded:  [phone]

## 2016-04-06 NOTE — Progress Notes (Signed)
Progress Note  Patient Name: William Rubio Date of Encounter: 04/06/2016  Primary Cardiologist: New  Subjective   Pt is pacing in the room, requesting Tylenol. He has a family member bringing his cardiac history records. He overall has CP only with a large breath or coughing. He does not have any pain at rest. He has intermittent SOB. Denies that his pain has been consistent with previous cardiac pains.  Troponin this morning is 0.11 which is elevated from 0.08 yesterday.  Inpatient Medications    Scheduled Meds: . acetaminophen  650 mg Oral Once  . aspirin EC  81 mg Oral Daily  . aztreonam  2 g Intravenous Q8H  . enoxaparin (LOVENOX) injection  40 mg Subcutaneous Q2200  . furosemide  20 mg Oral Daily  . guaiFENesin  600 mg Oral BID  . lisinopril  20 mg Oral Daily  . metoprolol  50 mg Oral BID  . pantoprazole  40 mg Oral Daily  . pravastatin  80 mg Oral Daily  . vancomycin  1,500 mg Intravenous Q12H   Continuous Infusions: . sodium chloride 75 mL/hr at 04/06/16 0856   PRN Meds: acetaminophen, gi cocktail, ipratropium-albuterol, morphine injection, ondansetron (ZOFRAN) IV   Vital Signs    Vitals:   04/05/16 2301 04/05/16 2324 04/06/16 0310 04/06/16 0435  BP: 135/78 133/66 109/67   Pulse: (!) 106 (!) 106 94   Resp: (!) 21 20 17    Temp:  98.9 F (37.2 C) 99 F (37.2 C) 99.5 F (37.5 C)  TempSrc:  Oral Oral Oral  SpO2: 94% 94% 95%   Weight:  270 lb 1.6 oz (122.5 kg)    Height:  5\' 10"  (1.778 m)      Intake/Output Summary (Last 24 hours) at 04/06/16 0905 Last data filed at 04/06/16 0700  Gross per 24 hour  Intake             6747 ml  Output             2665 ml  Net             4082 ml   Filed Weights   04/05/16 2324  Weight: 270 lb 1.6 oz (122.5 kg)    Telemetry    Sinus Tachycardia HR 100-110, multiple PVC's and 2 dropped beats. - Personally Reviewed  ECG    HR 99, NSR with prolonged PR interval, concern for inferior infarct - Personally  Reviewed  Physical Exam   GEN: No acute distress. Anxious, appears ill, standing up and pacing Neck: No JVD Cardiac:+ tachycardia, no murmurs, rubs, or gallops.  Respiratory: Clear to auscultation bilaterally.  GI: Soft, nontender, non-distended  MS: No edema; No deformity. Neuro:  Nonfocal  Psych: Normal affect   Labs    Chemistry Recent Labs Lab 04/04/16 0011 04/05/16 1746 04/06/16 0315  NA 135 135 136  K 4.1 4.4 3.8  CL 97* 96* 101  CO2 26 25 24   GLUCOSE 108* 122* 102*  BUN 13 11 6   CREATININE 0.93 1.00 0.78  CALCIUM 9.9 9.5 8.6*  PROT 7.6  --   --   ALBUMIN 4.5  --   --   AST 18  --   --   ALT 21  --   --   ALKPHOS 64  --   --   BILITOT 0.8  --   --   GFRNONAA >60 >60 >60  GFRAA >60 >60 >60  ANIONGAP 12 14 11      Hematology Recent  Labs Lab 04/04/16 0011 04/05/16 1746 04/06/16 0315  WBC 18.9* 24.7* 20.9*  RBC 5.13 4.78 4.33  HGB 15.9 14.9 13.3  HCT 45.9 42.2 38.8*  MCV 89.5 88.3 89.6  MCH 31.0 31.2 30.7  MCHC 34.6 35.3 34.3  RDW 14.7 14.3 14.9  PLT 353 348 361    Cardiac Enzymes Recent Labs Lab 04/05/16 2317 04/06/16 0315  TROPONINI 0.08* 0.11*    Recent Labs Lab 04/04/16 0014 04/05/16 1753  TROPIPOC 0.00 0.00     DDimer  Recent Labs Lab 04/05/16 2317  DDIMER 3.15*     Radiology    Ct Angio Chest Pe W Or Wo Contrast Result Date: 04/06/2016 1. No pulmonary embolus, allowing for limited assessment of the lower lobe pulmonary pulmonary arteries. 2. Left lower lobe consolidation likely pneumonia with small to moderate left pleural effusion. 3. Multi chamber cardiomegaly, post CABG. Small pericardial effusion/pericardial thickening.   Dg Chest Port 1 View Result Date: 04/05/2016 IMPRESSION: Increased left lower lung infiltrate or atelectasis, and probable pleural effusion .   Cardiac Studies   None  Patient Profile      41 year old gentleman with cardiac history is notable for coronary stent initially place 13-15 years ago in  [facility] [place]. Several years later he was diagnosed with having a weak heart. He cannot tell me if the initial stent was in the setting of acute infarction. 4 months ago he was admitted with dyspnea and fatigue and told that his stent had clogged up and "bypass surgery" was performed. He did not having harvested from his legs or arterial conduit was taken from his arms. Presumption is the bypass was done with a LIMA to the LAD. Current symptoms are dissimilar to those that led to hospitalization prior to recent bypass grafting. An AICD was placed at the time of surgery. According to the patient, his cardiologist had been encouraging and AICD for several years but he had refused until recently. He presented to the ER  with recent diagnosis of left lower lobe pneumonia treated with Levaquin and is now presumed septic and on IV vancomycin.  Assessment & Plan      CAD with Angina: Pain is pleuritic in nature but he has coronary hx of CAD with prior LAD stent and ultimately coronary bypass grading with LIMA to LAD 4 months ago (outside hospital).  -- Concern for troponin trending up. 0.08 <<< 0.11, demand ischemia ?? --continue aspirin and statin  Sepsis from pneumonia: Temperature of greater than 102 with left lower lobe pneumonia currently on vancomycin.  --pending pulmonary consultation to have pleural effusion tapped to r/o empyema --blood cultures pending to evaluate for bacteremia/endocarditis or device infection --lactic acidosis has resolved --bnp pending  Chronic systolic heart failure: working on getting medical records but currently it is presumed that the patient has ischemic cardiomyopathy. --continue I/O's and daily weights --continue lasix, meoprolol, lisinopril  Implantable cardiac defibrillator: records being transferred to hospital. Implanted 4 months ago at outside facility during CABG.  Hyperlipidemia --Cont statin  Will discuss elevated Troponin with Dr. Excell Seltzer for  further recommendations.  Signed, Dorthula Matas, PA-C  04/06/2016, 9:05 AM    Patient seen, examined. Available data reviewed. Agree with findings, assessment, and plan as outlined by William Pel, PA-C. The patient is independently interviewed and examined. He is alert and oriented, in no distress. JVP is normal. Lungs are clear with the exception of decreased breath sounds in the left base. Heart is tachycardic and regular without murmur or gallop. Abdomen is  soft and nontender. There is no peripheral edema.  Data is reviewed. Patient is status post CABG and ICD implantation. He has pneumonia with a parapneumonic effusion and I think his low level, flat troponin trend is no result of his acute illness (demand ischemia). There is no evidence of ACS. His description of chest pain is left-sided pleuritic pain consistent with pain related to pneumonia. No further cardiac evaluation is indicated at this time. If his blood cultures are positive, will need to touch base with the EP team to see if any further imaging studies such as a TEE may need to be done to evaluate his device. At this time it seems his clinical picture is consistent with pneumonia which hopefully will improve with systemic antibiotics. Await 2D Echo and records of his prior cardiac history from [place] surgery was performed.   Tonny BollmanMichael Leighton Brickley, M.D. 04/06/2016 11:43 AM

## 2016-04-06 NOTE — Progress Notes (Signed)
Pharmacy Antibiotic Note William Rubio is a 41 y.o. male admitted on 04/05/2016 with LLL PNA.  Pharmacy was originally consulted for Azactam and vancomycin dosing.  The patient's penicillin allergy was clarified today by the patient as a mild rash. The patient believes that he has taken a cephalosporin before without any issues. Discussed with the MD and will transition the patient from Azactam to Ceftazidime for gram negative coverage.  Plan: 1. D/c Azactam 2. Start Ceftazidime 1g IV every 8 hours 3. Continue Vancomycin 1500 mg IV every 12 hours 4. Will continue to follow renal function, culture results, LOT, and antibiotic de-escalation plans   Height: 5\' 10"  (177.8 cm) Weight: 270 lb 1.6 oz (122.5 kg) IBW/kg (Calculated) : 73  Temp (24hrs), Avg:99.8 F (37.7 C), Min:98.6 F (37 C), Max:101.9 F (38.8 C)   Recent Labs Lab 04/04/16 0011 04/04/16 0017 04/05/16 1746 04/05/16 2005 04/05/16 2317 04/06/16 0315  WBC 18.9*  --  24.7*  --   --  20.9*  CREATININE 0.93  --  1.00  --   --  0.78  LATICACIDVEN  --  1.39  --  2.28* 1.9 1.4    Estimated Creatinine Clearance: 157.9 mL/min (by C-G formula based on SCr of 0.78 mg/dL).    Allergies  Allergen Reactions  . Penicillins Rash    Rash as both a kid and as an adult  . Other     Bee stings    Antimicrobials this admission: 3/26 Azactam >> 3/27 3/26 vancomycin  >>  3/27 Ceftazidime >>  Microbiology results: 3/26 BCx: px 3/25 BCx: ngtd  Thank you for allowing pharmacy to be a part of this patient's care.  Georgina PillionElizabeth Faryal Marxen, PharmD, BCPS Clinical Pharmacist Pager: [phone] Clinical phone for 04/06/2016 from 7a-3:30p: [phone] If after 3:30p, please call main pharmacy at: [phone] 04/06/2016 2:10 PM

## 2016-04-07 ENCOUNTER — Other Ambulatory Visit (HOSPITAL_COMMUNITY): Payer: BLUE CROSS/BLUE SHIELD

## 2016-04-07 ENCOUNTER — Encounter: Payer: Self-pay | Admitting: Physician Assistant

## 2016-04-07 DIAGNOSIS — R0602 Shortness of breath: Secondary | ICD-10-CM

## 2016-04-07 DIAGNOSIS — Z9889 Other specified postprocedural states: Secondary | ICD-10-CM

## 2016-04-07 LAB — BASIC METABOLIC PANEL
Anion gap: 7 (ref 5–15)
BUN: 6 mg/dL (ref 6–20)
CALCIUM: 8.7 mg/dL — AB (ref 8.9–10.3)
CO2: 27 mmol/L (ref 22–32)
CREATININE: 0.78 mg/dL (ref 0.61–1.24)
Chloride: 103 mmol/L (ref 101–111)
GFR calc non Af Amer: >60 mL/min (ref >60)
Glucose, Bld: 96 mg/dL (ref 65–99)
Potassium: 4 mmol/L (ref 3.5–5.1)
SODIUM: 137 mmol/L (ref 135–145)

## 2016-04-07 LAB — CBC
HCT: 37.3 % — ABNORMAL LOW (ref 39.0–52.0)
Hemoglobin: 12.6 g/dL — ABNORMAL LOW (ref 13.0–17.0)
MCH: 30.4 pg (ref 26.0–34.0)
MCHC: 33.8 g/dL (ref 30.0–36.0)
MCV: 90.1 fL (ref 78.0–100.0)
Platelets: 386 10*3/uL (ref 150–400)
RBC: 4.14 MIL/uL — ABNORMAL LOW (ref 4.22–5.81)
RDW: 15.2 % (ref 11.5–15.5)
WBC: 12.9 10*3/uL — ABNORMAL HIGH (ref 4.0–10.5)

## 2016-04-07 LAB — PROTEIN, PLEURAL OR PERITONEAL FLUID: TOTAL PROTEIN, FLUID: 4.4 g/dL

## 2016-04-07 LAB — HIV ANTIBODY (ROUTINE TESTING W REFLEX): HIV Screen 4th Generation wRfx: NONREACTIVE

## 2016-04-07 LAB — PATHOLOGIST SMEAR REVIEW

## 2016-04-07 LAB — LEGIONELLA PNEUMOPHILA SEROGP 1 UR AG: L. pneumophila Serogp 1 Ur Ag: NEGATIVE

## 2016-04-07 NOTE — Progress Notes (Signed)
Progress Note  Patient Name: William Rubio Date of Encounter: 04/07/2016  Primary Cardiologist: New  Subjective   Patient feeling overall better. Has not had any changes in the quality of his CP or SOB. He has obtained his cardiac records. I made a copy of these and put them in his physical folder with the nurse. Blood cultures, bodily fluids and cardiac echo pending.  Inpatient Medications    Scheduled Meds: . aspirin EC  81 mg Oral Daily  . cefTAZidime (FORTAZ)  IV  1 g Intravenous Q8H  . enoxaparin (LOVENOX) injection  40 mg Subcutaneous Q2200  . furosemide  20 mg Oral Daily  . guaiFENesin  600 mg Oral BID  . lisinopril  20 mg Oral Daily  . metoprolol  50 mg Oral BID  . pantoprazole  40 mg Oral Daily  . pravastatin  80 mg Oral Daily  . vancomycin  1,500 mg Intravenous Q12H   Continuous Infusions:  PRN Meds: acetaminophen, gi cocktail, ipratropium-albuterol, morphine injection, ondansetron (ZOFRAN) IV   Vital Signs    Vitals:   04/06/16 1604 04/06/16 1615 04/06/16 2005 04/07/16 0325  BP: (!) 106/58 94/65 112/70 111/66  Pulse:   79 67  Resp:   18 18  Temp:   98.4 F (36.9 C) 98 F (36.7 C)  TempSrc:   Oral Oral  SpO2:   95% 95%  Weight:      Height:        Intake/Output Summary (Last 24 hours) at 04/07/16 0803 Last data filed at 04/06/16 2331  Gross per 24 hour  Intake             1270 ml  Output             3000 ml  Net            -1730 ml   Filed Weights   04/05/16 2324  Weight: 270 lb 1.6 oz (122.5 kg)    Telemetry    NSR, HR 83. Multiple PVC's - Personally Reviewed  ECG    None since yesterday - Personally Reviewed  Physical Exam   GEN:No acute distress. g Neck:No JVD Cardiac: regular rate and rhythm murmurs, rubs, or gallops.   Respiratory:Clear to auscultation bilaterally.  ZO:XWRUGI:Soft, nontender, non-distended  MS:No edema; No deformity. Neuro:Nonfocal  Psych: Normal affect   Labs    Chemistry Recent Labs Lab  04/04/16 0011 04/05/16 1746 04/06/16 0315 04/06/16 1207 04/07/16 0258  NA 135 135 136  --  137  K 4.1 4.4 3.8  --  4.0  CL 97* 96* 101  --  103  CO2 26 25 24   --  27  GLUCOSE 108* 122* 102*  --  96  BUN 13 11 6   --  6  CREATININE 0.93 1.00 0.78  --  0.78  CALCIUM 9.9 9.5 8.6*  --  8.7*  PROT 7.6  --   --  6.8  --   ALBUMIN 4.5  --   --  3.4*  --   AST 18  --   --   --   --   ALT 21  --   --   --   --   ALKPHOS 64  --   --   --   --   BILITOT 0.8  --   --   --   --   GFRNONAA >60 >60 >60  --  >60  GFRAA >60 >60 >60  --  >60  ANIONGAP 12  14 11  --  7     Hematology Recent Labs Lab 04/05/16 1746 04/06/16 0315 04/07/16 0258  WBC 24.7* 20.9* 12.9*  RBC 4.78 4.33 4.14*  HGB 14.9 13.3 12.6*  HCT 42.2 38.8* 37.3*  MCV 88.3 89.6 90.1  MCH 31.2 30.7 30.4  MCHC 35.3 34.3 33.8  RDW 14.3 14.9 15.2  PLT 348 361 386    Cardiac Enzymes Recent Labs Lab 04/05/16 2317 04/06/16 0315 04/06/16 1207  TROPONINI 0.08* 0.11* 0.07*    Recent Labs Lab 04/04/16 0014 04/05/16 1753  TROPIPOC 0.00 0.00     BNP Recent Labs Lab 04/06/16 1207  BNP 203.6*     DDimer  Recent Labs Lab 04/05/16 2317  DDIMER 3.15*     Radiology    Dg Chest 1 View Result Date: 04/06/2016 No postprocedure pneumothorax following left-sided thoracentesis.   US Thoracentesis Asp Pleural Space W/img Guide Result Date: 04/06/2016 Successful ultrasound guided diagnostic left thoracentesis yielding 120 mL of pleural fluid.   Ct Angio Chest Pe W Or Wo Contrast Result Date: 04/06/2016 1. No pulmonary embolus, allowing for limited assessment of the lower lobe pulmonary pulmonary arteries. 2. Left lower lobe consolidation likely pneumonia with small to moderate left pleural effusion. 3. Multi chamber cardiomegaly, post CABG. Small pericardial effusion/pericardial thickening.   Dg Chest Port 1 View Result Date: 04/05/2016 IMPRESSION: Increased left lower lung infiltrate or atelectasis, and probable  pleural effusion .    Cardiac Studies   Echo pending  Patient Profile      41 year old gentleman with cardiac history is notable for coronary stent initially place 13-15 years ago in [facility] [place]. Several years later he was diagnosed with having a weak heart. He cannot tell me if the initial stent was in the setting of acute infarction. 4 months ago he was admitted with dyspnea and fatigue and told that his stent had clogged up and "bypass surgery"was performed. He did not having harvested from his legs or arterial conduit was taken from his arms. Presumption is the bypass was done with a LIMA to the LAD. Current symptoms are dissimilar to those that led to hospitalization prior to recent bypass grafting. An AICD was placed at the time of surgery. According to the patient, his cardiologist had been encouraging and AICD for several years but he had refused until recently. He presented to the ER  with recent diagnosis of left lower lobe pneumonia treated with Levaquin and is now presumed septic and on IV vancomycin.  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 He has pneumonia with a parapneumonic effusion and I think his low level, flat troponin trend is no result of his acute illness (demand ischemia). There is no evidence of ACS. His description of chest pain is left-sided pleuritic pain consistent with pain related to pneumonia. No further cardiac evaluation is indicated at this time. If his blood cultures are positive, will need to touch base with the EP team to see if any further imaging studies such as a TEE may need to be done to evaluate his device- per Dr. Excell Seltzer  Sepsis from pneumonia: Temperature of greater than 102 with left lower lobe pneumonia currently on vancomycin.  --had a thoracentesis performed yesterday, elevated white count. Waiting for blood cultures, body fluid cultures and their interpretation.   Chronic systolic heart failure: working  on getting medical records but currently it is presumed that the patient has ischemic cardiomyopathy. --continue I/O's and daily weights --continue  lasix, meoprolol, lisinopril --Echo pending  Implantable cardiac defibrillator: records being transferred to hospital. Implanted 4 months ago at outside facility during CABG.  Hyperlipidemia --Cont statin  At this time, we are waiting for 2D echo results and blood and body fluid culture reports for further recommendations. He has not had any change in the quality of his chest pain.  SignedDorthula Matas, PA-C  04/07/2016, 8:03 AM    Patient seen, examined. Available data reviewed. Agree with findings, assessment, and plan as outlined by Marlon Pel, PA-C. The patient is independently interviewed and examined. His lung fields are clear. Heart is regular rate and rhythm without murmur or gallop. Extremities are without edema. I have reviewed his outside records and he underwent 2 vessel CABG with a RIMA to LAD and LIMA to the PDA. There were no vein grafts utilized. He's had an ischemic cardiomyopathy and underwent ICD implantation in November 2017. Likely is improving with antibiotics for treatment of his pneumonia and parapneumonic effusion. He does not appear to have any acute cardiac issues at this time. As long as his blood cultures remain negative, would not anticipate any further cardiac testing. Please notify us if blood cultures turn positive as the patient should likely undergo TEE if that were to occur. Will establish him with a cardiologist in our practice.  Tonny Bollman, M.D. 04/07/2016 11:47 AM

## 2016-04-07 NOTE — Progress Notes (Addendum)
PROGRESS NOTE                                                                                                                                                                                                             Patient Demographics:    William Rubio, is a 41 y.o. male, DOB - [birth year], WJX:[id number]  Admit date - 04/05/2016   Admitting Physician Clydie Braun, MD  Outpatient Primary MD for the patient is Default, Provider, MD  LOS - 1  Chief Complaint  Patient presents with  . Chest Pain       Brief Narrative  William Rubio is a 41 y.o. male with medical history significant of HLD, CAD s/p CABG, CHF, and s/p AICD;  who presents with complaints of intermittent fevers with chest and back pain. 2 days ago patient reports being evaluated in the emergency department and determined to have a left sided pneumonia, and possible left-sided parapneumonic effusion on CT angiogram of the chest.   Subjective:   No complaints this AM. Not on oxygen.    Assessment  & Plan :   LLL CAP with parapneumonic effusion -Came in with cough, fever and CT angiography showed left lower lobe pneumonia. -Failed outpatient oral therapy, started on vancomycin and Ceftaz. -Improving, currently afebrile. -Blood cultures obtained, 4, to make sure he does not have bacteremia in settings of AICD.  Sepsis -Present on admission with fever of 101.9 and WBCs 20.9 in the presence of pneumonia. -Lactic acid was elevated at 2.59 suggesting hypoperfusion. -Treated with IV fluids and antibiotics, sepsis physiology resolved.  History of CAD, chronic systolic CHF. He is status post CABG and AICD placement in [place]. -He moved recently to [facility], does not follow with cardiologist yet. Ischemic cardiomyopathy with LVEF of 30% -Has minimal troponin elevation of 0.08 and peak of 0.11, this is likely demand ischemia and not  ACS. -Currently does not have any chest pain or shortness of breath. -Per reports that his ejection fraction is 30-35%, he has no acute symptoms suggesting decompensation.  Dyslipidemia. Continue statin.  GERD. On PPI.    Diet : Diet Heart Room service appropriate? Yes; Fluid consistency: Thin; Fluid restriction: 1500 mL Fluid    Family Communication  : None  Code Status :  Full  Disposition Plan  :  TBD  Consults  :  Cards  Procedures  :    CTA - Left-sided infiltrate and moderate left pleural effusion. No PE.  Echocardiogram -   Ultrasound-guided left-sided thoracentesis ordered  DVT Prophylaxis  :  Lovenox    Lab Results  Component Value Date   PLT 386 04/07/2016    Inpatient Medications  Scheduled Meds: . aspirin EC  81 mg Oral Daily  . cefTAZidime (FORTAZ)  IV  1 g Intravenous Q8H  . enoxaparin (LOVENOX) injection  40 mg Subcutaneous Q2200  . furosemide  20 mg Oral Daily  . guaiFENesin  600 mg Oral BID  . lisinopril  20 mg Oral Daily  . metoprolol  50 mg Oral BID  . pantoprazole  40 mg Oral Daily  . pravastatin  80 mg Oral Daily  . vancomycin  1,500 mg Intravenous Q12H   Continuous Infusions: PRN Meds:.acetaminophen, gi cocktail, ipratropium-albuterol, morphine injection, ondansetron (ZOFRAN) IV  Antibiotics  :    Anti-infectives    Start     Dose/Rate Route Frequency Ordered Stop   04/06/16 2200  cefTAZidime (FORTAZ) 1 g in dextrose 5 % 50 mL IVPB     1 g 100 mL/hr over 30 Minutes Intravenous Every 8 hours 04/06/16 1411     04/06/16 1000  vancomycin (VANCOCIN) 1,500 mg in sodium chloride 0.9 % 500 mL IVPB     1,500 mg 250 mL/hr over 120 Minutes Intravenous Every 12 hours 04/05/16 2058     04/05/16 2100  aztreonam (AZACTAM) 1 g in dextrose 5 % 50 mL IVPB  Status:  Discontinued     1 g 100 mL/hr over 30 Minutes Intravenous Every 8 hours 04/05/16 2049 04/05/16 2054   04/05/16 2100  vancomycin (VANCOCIN) 2,000 mg in sodium chloride 0.9 % 500 mL IVPB      2,000 mg 250 mL/hr over 120 Minutes Intravenous  Once 04/05/16 2049 04/05/16 2350   04/05/16 2100  aztreonam (AZACTAM) 2 g in dextrose 5 % 50 mL IVPB  Status:  Discontinued     2 g 100 mL/hr over 30 Minutes Intravenous Every 8 hours 04/05/16 2054 04/06/16 1406         Objective:   Vitals:   04/06/16 1615 04/06/16 2005 04/07/16 0325 04/07/16 1040  BP: 94/65 112/70 111/66 131/89  Pulse:  79 67 86  Resp:  18 18   Temp:  98.4 F (36.9 C) 98 F (36.7 C)   TempSrc:  Oral Oral   SpO2:  95% 95%   Weight:      Height:        Wt Readings from Last 3 Encounters:  04/05/16 122.5 kg (270 lb 1.6 oz)  04/03/16 120.3 kg (265 lb 4 oz)     Intake/Output Summary (Last 24 hours) at 04/07/16 1057 Last data filed at 04/07/16 0900  Gross per 24 hour  Intake             1390 ml  Output             4600 ml  Net            -3210 ml     Physical Exam  Awake Alert, Oriented X 3, No new F.N deficits, Normal affect [place].AT,PERRAL Supple Neck,No JVD, No cervical lymphadenopathy appriciated.  Symmetrical Chest wall movement, LLL rales RRR,No Gallops,Rubs or new Murmurs, No Parasternal Heave +ve B.Sounds, Abd Soft, No tenderness, No organomegaly appriciated, No rebound - guarding or rigidity. No Cyanosis, Clubbing or edema, No new Rash or bruise  Data Review:    CBC  Recent Labs Lab 04/04/16 0011 04/05/16 1746 04/06/16 0315 04/07/16 0258  WBC 18.9* 24.7* 20.9* 12.9*  HGB 15.9 14.9 13.3 12.6*  HCT 45.9 42.2 38.8* 37.3*  PLT 353 348 361 386  MCV 89.5 88.3 89.6 90.1  MCH 31.0 31.2 30.7 30.4  MCHC 34.6 35.3 34.3 33.8  RDW 14.7 14.3 14.9 15.2  LYMPHSABS 4.2*  --  5.6*  --   MONOABS 2.6*  --  3.7*  --   EOSABS 0.0  --  0.2  --   BASOSABS 0.0  --  0.1  --     Chemistries   Recent Labs Lab 04/04/16 0011 04/05/16 1746 04/06/16 0315 04/07/16 0258  NA 135 135 136 137  K 4.1 4.4 3.8 4.0  CL 97* 96* 101 103  CO2 26 25 24 27   GLUCOSE 108* 122* 102* 96  BUN 13 11 6 6    CREATININE 0.93 1.00 0.78 0.78  CALCIUM 9.9 9.5 8.6* 8.7*  AST 18  --   --   --   ALT 21  --   --   --   ALKPHOS 64  --   --   --   BILITOT 0.8  --   --   --    ------------------------------------------------------------------------------------------------------------------ No results for input(s): CHOL, HDL, LDLCALC, TRIG, CHOLHDL, LDLDIRECT in the last 72 hours.  No results found for: HGBA1C ------------------------------------------------------------------------------------------------------------------ No results for input(s): TSH, T4TOTAL, T3FREE, THYROIDAB in the last 72 hours.  Invalid input(s): FREET3 ------------------------------------------------------------------------------------------------------------------ No results for input(s): VITAMINB12, FOLATE, FERRITIN, TIBC, IRON, RETICCTPCT in the last 72 hours.  Coagulation profile  Recent Labs Lab 04/06/16 1207  INR 1.16     Recent Labs  04/05/16 2317  DDIMER 3.15*    Cardiac Enzymes  Recent Labs Lab 04/05/16 2317 04/06/16 0315 04/06/16 1207  TROPONINI 0.08* 0.11* 0.07*   ------------------------------------------------------------------------------------------------------------------    Component Value Date/Time   BNP 203.6 (H) 04/06/2016 1207    Micro Results Recent Results (from the past 240 hour(s))  Blood culture (routine x 2)     Status: None (Preliminary result)   Collection Time: 04/04/16 12:05 AM  Result Value Ref Range Status   Specimen Description BLOOD RIGHT ANTECUBITAL  Final   Special Requests BOTTLES DRAWN AEROBIC AND ANAEROBIC 5CC  Final   Culture NO GROWTH 2 DAYS  Final   Report Status PENDING  Incomplete  Blood culture (routine x 2)     Status: None (Preliminary result)   Collection Time: 04/04/16 12:10 AM  Result Value Ref Range Status   Specimen Description BLOOD LEFT ANTECUBITAL  Final   Special Requests BOTTLES DRAWN AEROBIC AND ANAEROBIC 5CC  Final   Culture NO GROWTH 2  DAYS  Final   Report Status PENDING  Incomplete  Culture, blood (routine x 2)     Status: None (Preliminary result)   Collection Time: 04/05/16  6:46 PM  Result Value Ref Range Status   Specimen Description BLOOD RIGHT ANTECUBITAL  Final   Special Requests BOTTLES DRAWN AEROBIC AND ANAEROBIC 5CC  Final   Culture NO GROWTH < 24 HOURS  Final   Report Status PENDING  Incomplete  Culture, blood (routine x 2)     Status: None (Preliminary result)   Collection Time: 04/05/16  7:45 PM  Result Value Ref Range Status   Specimen Description BLOOD LEFT ANTECUBITAL  Final   Special Requests IN PEDIATRIC BOTTLE 3CC  Final   Culture NO GROWTH < 24 HOURS  Final   Report Status PENDING  Incomplete  Gram stain     Status: None   Collection Time: 04/06/16  4:16 PM  Result Value Ref Range Status   Specimen Description FLUID LEFT PLEURAL  Final   Special Requests NONE  Final   Gram Stain   Final    ABUNDANT WBC PRESENT,BOTH PMN AND MONONUCLEAR NO ORGANISMS SEEN    Report Status 04/06/2016 FINAL  Final    Radiology Reports Dg Chest 1 View  Result Date: 04/06/2016 CLINICAL DATA:  Status post left-sided thoracentesis. EXAM: CHEST 1 VIEW COMPARISON:  Portable chest x-ray of April 05, 2016 FINDINGS: There is no postprocedure pneumothorax. The left hemidiaphragm remains obscured. A small amount of pleural fluid is likely present still on the left. On the right the lung is hypoinflated. The interstitial markings are coarse. The cardiac silhouette is enlarged. The pulmonary vascularity is prominent centrally. The ICD is in stable position. The patient has undergone previous median sternotomy. IMPRESSION: No postprocedure pneumothorax following left-sided thoracentesis. Electronically Signed   By: David  SwazilandJordan M.D.   On: 04/06/2016 16:34   Dg Chest 2 View  Result Date: 04/04/2016 CLINICAL DATA:  Left side rib pain, fever, slight sob all today, HTN, ex-smoker. Hx CABG and defibrillator placement 10/2015 EXAM:  CHEST  2 VIEW COMPARISON:  None. FINDINGS: Status post median sternotomy. Left-sided transvenous pacemaker lead to the right ventricle. The heart is mildly enlarged. Right lung is clear. There is opacity at the posterior left lung base which obscures the posterior portion of the hemidiaphragm. Findings are consistent with atelectasis or infiltrate. No pneumothorax or acute rib fractures are identified. IMPRESSION: 1. Opacity at the left lung base consistent with atelectasis or more likely, infiltrate. Followup PA and lateral chest X-ray is recommended in 3-4 weeks following trial of antibiotic therapy to ensure resolution and exclude underlying malignancy. 2. Cardiomegaly without pulmonary edema. Electronically Signed   By: Norva PavlovElizabeth  Brown M.D.   On: 04/04/2016 00:05   Ct Angio Chest Pe W Or Wo Contrast  Result Date: 04/06/2016 CLINICAL DATA:  Chest pain. EXAM: CT ANGIOGRAPHY CHEST WITH CONTRAST TECHNIQUE: Multidetector CT imaging of the chest was performed using the standard protocol during bolus administration of intravenous contrast. Multiplanar CT image reconstructions and MIPs were obtained to evaluate the vascular anatomy. CONTRAST:  79 cc Isovue 370 IV COMPARISON:  Radiographs yesterday as well as 04/03/2016 FINDINGS: Cardiovascular: Technically limited lower lobe evaluation due to breathing motion and contrast bolus timing. Allowing for this, no filling defects in the pulmonary arteries to suggest pulmonary embolus. Post CABG with calcification of the native coronary arteries. Multi chamber cardiomegaly. Pacemaker with tip about the right ventricle. Thoracic aorta is normal in caliber. Mediastinum/Nodes: Small pericardial effusion/ pericardial thickening. Enlarged prevascular node measures 14 mm. Small paratracheal nodes. No right hilar adenopathy. Left hilum partially obscured by adjacent left lung consolidation. The esophagus is decompressed. Lungs/Pleura: Left lower lobe consolidation and  small-moderate left pleural effusion. Additional patchy opacities in the perihilar left upper lobe. Minimal linear atelectasis in the right lung. No right pleural effusion. Upper Abdomen: No acute abnormality.  Post splenectomy. Musculoskeletal: Degenerative change in the lower thoracic spine. Post median sternotomy. There are no acute or suspicious osseous abnormalities. Review of the MIP images confirms the above findings. IMPRESSION: 1. No pulmonary embolus, allowing for limited assessment of the lower lobe pulmonary pulmonary arteries. 2. Left lower lobe consolidation likely pneumonia with small to moderate left pleural effusion. 3. Multi chamber cardiomegaly, post CABG.  Small pericardial effusion/pericardial thickening. Electronically Signed   By: Rubye Oaks M.D.   On: 04/06/2016 05:38   Dg Chest Port 1 View  Result Date: 04/05/2016 CLINICAL DATA:  Left-sided pleuritic chest pain radiating to back. EXAM: PORTABLE CHEST 1 VIEW COMPARISON:  04/03/2016 FINDINGS: Increased opacity is seen in the retrocardiac left lung base, likely representing a combination of pleural effusion and pulmonary infiltrate or atelectasis. Right lung is clear. Heart size is stable. Prior median sternotomy. Single lead transvenous pacemaker in appropriate position. IMPRESSION: Increased left lower lung infiltrate or atelectasis, and probable pleural effusion . Electronically Signed   By: Myles Rosenthal M.D.   On: 04/05/2016 18:23   US Thoracentesis Asp Pleural Space W/img Guide  Result Date: 04/06/2016 INDICATION: Patient with history of congestive heart failure presents with left pleural effusion. Request is made for diagnostic and therapeutic left thoracentesis. EXAM: ULTRASOUND GUIDED DIAGNOSTIC AND THERAPEUTIC LEFT THORACENTESIS MEDICATIONS: 10 mL 1% lidocaine COMPLICATIONS: None immediate. PROCEDURE: An ultrasound guided thoracentesis was thoroughly discussed with the patient and questions answered. The benefits, risks,  alternatives and complications were also discussed. The patient understands and wishes to proceed with the procedure. Written consent was obtained. Ultrasound was performed to localize and mark a small but adequate pocket of fluid in the left chest. The area was then prepped and draped in the normal sterile fashion. 1% Lidocaine was used for local anesthesia. Under ultrasound guidance a Safe-T-centesis catheter was introduced. Thoracentesis was performed. The catheter was removed and a dressing applied. FINDINGS: A total of approximately 120 mL of hazy, yellow fluid was removed. Samples were sent to the laboratory as requested by the clinical team. IMPRESSION: Successful ultrasound guided diagnostic left thoracentesis yielding 120 mL of pleural fluid. Read by:  Loyce Dys PA-C Electronically Signed   By: Simonne Come M.D.   On: 04/06/2016 16:34    Time Spent in minutes  30   Rosamund Nyland A M.D on 04/07/2016 at 10:57 AM  Between 7am to 7pm - Pager - [phone] ( page only via amion, text pages only, please mention full 10 digit call back number).  After 7pm go to [email or web address] - password [facility]  [facility] -  Office  [phone]

## 2016-04-08 ENCOUNTER — Other Ambulatory Visit (HOSPITAL_COMMUNITY): Payer: BLUE CROSS/BLUE SHIELD

## 2016-04-08 LAB — PH, BODY FLUID: pH, Body Fluid: 7.6

## 2016-04-08 MED ORDER — LEVOFLOXACIN 750 MG PO TABS
750.0000 mg | ORAL_TABLET | Freq: Every day | ORAL | Status: DC
  Administered 2016-04-08: 750 mg via ORAL
  Filled 2016-04-08: qty 1

## 2016-04-08 MED ORDER — LEVOFLOXACIN 750 MG PO TABS: 750.0000 mg | ORAL_TABLET | Freq: Every day | ORAL | 0 refills | Status: DC

## 2016-04-08 NOTE — Progress Notes (Signed)
Discussed with the patient and all questioned fully answered. He will call me if any problems arise.  IV removed. Telemetry removed, CCMD notified.   Epiphany Seltzer S [place], RN   

## 2016-04-08 NOTE — Discharge Summary (Signed)
Physician Discharge Summary  William Rubio ZOX:[id number] DOB: 1975/09/22 DOA: 04/05/2016  PCP: Default, Provider, MD  Admit date: 04/05/2016 Discharge date: 04/08/2016  Admitted From: Home Disposition: Home  Recommendations for Outpatient Follow-up:  1. Follow up with PCP in 1-2 weeks 2. Please obtain BMP/CBC in one week  Home Health: NA Equipment/Devices:NA  Discharge Condition: Stable CODE STATUS: Full Code Diet recommendation: Diet Heart Room service appropriate? Yes; Fluid consistency: Thin; Fluid restriction: 1500 mL Fluid Diet - low sodium heart healthy  Brief/Interim Summary: William Rubio a 41 y.o.malewith medical history significant of HLD, CAD s/p CABG, CHF, and s/p AICD; who presents with complaints of intermittent fevers with chest and back pain. 2 days ago patient reports being evaluated in the emergency department and determined to have a left sided pneumonia, and possible left-sided parapneumonic effusion on CT angiogram of the chest.  Discharge Diagnoses:  Principal Problem:   Sepsis (HCC) Active Problems:   CAD (coronary artery disease) of artery bypass graft   Chronic systolic heart failure (HCC)   LLL pneumonia (HCC)   Pleural effusion   Pleurisy with effusion   Chest pain   LLL CAP with parapneumonic effusion -Came in with cough, fever and CT angiography showed left lower lobe pneumonia. -Failed outpatient oral therapy, started on vancomycin and Ceftaz. -Improving, currently afebrile. -Blood cultures obtained, 4, There is no positive blood culture to suggest bacteremia in setting of the AICD. -Discharged home on levofloxacin 750 mg for 5 more days.  Sepsis -Present on admission with fever of 101.9 and WBCs 20.9 in the presence of pneumonia. -Lactic acid was elevated at 2.59 suggesting hypoperfusion. -Treated with IV fluids and antibiotics, sepsis physiology resolved.  History of CAD, chronic systolic CHF. He is status post CABG and AICD  placement in [place]. -He moved recently to [facility], does not follow with cardiologist yet. Ischemic cardiomyopathy with LVEF of 30% -Has minimal troponin elevation of 0.08 and peak of 0.11, this is likely demand ischemia and not ACS. -Currently does not have any chest pain or shortness of breath. -Per reports that his ejection fraction is 30-35%, he has no acute symptoms suggesting decompensation. -Seen by cardiology and they will follow him as outpatient for his ischemic cardiomyopathy.  Dyslipidemia. Continue statin.  GERD. On PPI.  Discharge Instructions  Discharge Instructions    Diet - low sodium heart healthy    Complete by:  As directed    Increase activity slowly    Complete by:  As directed      Allergies as of 04/08/2016      Reactions   Penicillins Rash   Rash as both a kid and as an adult   Other    Bee stings      Medication List    TAKE these medications   aspirin EC 81 MG tablet Take 81 mg by mouth daily.   furosemide 20 MG tablet Commonly known as:  LASIX Take 20 mg by mouth daily.   levofloxacin 750 MG tablet Commonly known as:  LEVAQUIN Take 1 tablet (750 mg total) by mouth daily. X 7 days   lisinopril 20 MG tablet Commonly known as:  PRINIVIL,ZESTRIL Take 20 mg by mouth daily.   metoprolol 50 MG tablet Commonly known as:  LOPRESSOR Take 50 mg by mouth 2 (two) times daily.   omeprazole 20 MG tablet Commonly known as:  PRILOSEC OTC Take 20 mg by mouth daily.   pravastatin 80 MG tablet Commonly known as:  PRAVACHOL Take 80 mg  by mouth daily.   traMADol 50 MG tablet Commonly known as:  ULTRAM Take 1 tablet (50 mg total) by mouth every 6 (six) hours as needed.      Follow-up Information    Bhagat,Bhavinkumar, PA Follow up on 04/21/2016.   Specialty:  Cardiology Why:  Appointment is at 2:30pm, please arrive 15 minutes early. Contact information: [address] [place] [address] [phone]           Allergies  Allergen Reactions  . Penicillins Rash    Rash as both a kid and as an adult  . Other     Bee stings    Consultations:  Treatment Team:   Rounding Lbcardiology, MD  Procedures (Echo, Carotid, EGD, Colonoscopy, ERCP)   Radiological studies: Dg Chest 1 View  Result Date: 04/06/2016 CLINICAL DATA:  Status post left-sided thoracentesis. EXAM: CHEST 1 VIEW COMPARISON:  Portable chest x-ray of April 05, 2016 FINDINGS: There is no postprocedure pneumothorax. The left hemidiaphragm remains obscured. A small amount of pleural fluid is likely present still on the left. On the right the lung is hypoinflated. The interstitial markings are coarse. The cardiac silhouette is enlarged. The pulmonary vascularity is prominent centrally. The ICD is in stable position. The patient has undergone previous median sternotomy. IMPRESSION: No postprocedure pneumothorax following left-sided thoracentesis. Electronically Signed   By: David  [place] M.D.   On: 04/06/2016 16:34   Dg Chest 2 View  Result Date: 04/04/2016 CLINICAL DATA:  Left side rib pain, fever, slight sob all today, HTN, ex-smoker. Hx CABG and defibrillator placement 10/2015 EXAM: CHEST  2 VIEW COMPARISON:  None. FINDINGS: Status post median sternotomy. Left-sided transvenous pacemaker lead to the right ventricle. The heart is mildly enlarged. Right lung is clear. There is opacity at the posterior left lung base which obscures the posterior portion of the hemidiaphragm. Findings are consistent with atelectasis or infiltrate. No pneumothorax or acute rib fractures are identified. IMPRESSION: 1. Opacity at the left lung base consistent with atelectasis or more likely, infiltrate. Followup PA and lateral chest X-ray is recommended in 3-4 weeks following trial of antibiotic therapy to ensure resolution and exclude underlying malignancy. 2. Cardiomegaly without pulmonary edema. Electronically Signed   By: Norva Pavlov M.D.   On: 04/04/2016  00:05   Ct Angio Chest Pe W Or Wo Contrast  Result Date: 04/06/2016 CLINICAL DATA:  Chest pain. EXAM: CT ANGIOGRAPHY CHEST WITH CONTRAST TECHNIQUE: Multidetector CT imaging of the chest was performed using the standard protocol during bolus administration of intravenous contrast. Multiplanar CT image reconstructions and MIPs were obtained to evaluate the vascular anatomy. CONTRAST:  79 cc Isovue 370 IV COMPARISON:  Radiographs yesterday as well as 04/03/2016 FINDINGS: Cardiovascular: Technically limited lower lobe evaluation due to breathing motion and contrast bolus timing. Allowing for this, no filling defects in the pulmonary arteries to suggest pulmonary embolus. Post CABG with calcification of the native coronary arteries. Multi chamber cardiomegaly. Pacemaker with tip about the right ventricle. Thoracic aorta is normal in caliber. Mediastinum/Nodes: Small pericardial effusion/ pericardial thickening. Enlarged prevascular node measures 14 mm. Small paratracheal nodes. No right hilar adenopathy. Left hilum partially obscured by adjacent left lung consolidation. The esophagus is decompressed. Lungs/Pleura: Left lower lobe consolidation and small-moderate left pleural effusion. Additional patchy opacities in the perihilar left upper lobe. Minimal linear atelectasis in the right lung. No right pleural effusion. Upper Abdomen: No acute abnormality.  Post splenectomy. Musculoskeletal: Degenerative change in the lower thoracic spine. Post median sternotomy.  There are no acute or suspicious osseous abnormalities. Review of the MIP images confirms the above findings. IMPRESSION: 1. No pulmonary embolus, allowing for limited assessment of the lower lobe pulmonary pulmonary arteries. 2. Left lower lobe consolidation likely pneumonia with small to moderate left pleural effusion. 3. Multi chamber cardiomegaly, post CABG. Small pericardial effusion/pericardial thickening. Electronically Signed   By: Rubye OaksMelanie  Ehinger M.D.    On: 04/06/2016 05:38   Dg Chest Port 1 View  Result Date: 04/05/2016 CLINICAL DATA:  Left-sided pleuritic chest pain radiating to back. EXAM: PORTABLE CHEST 1 VIEW COMPARISON:  04/03/2016 FINDINGS: Increased opacity is seen in the retrocardiac left lung base, likely representing a combination of pleural effusion and pulmonary infiltrate or atelectasis. Right lung is clear. Heart size is stable. Prior median sternotomy. Single lead transvenous pacemaker in appropriate position. IMPRESSION: Increased left lower lung infiltrate or atelectasis, and probable pleural effusion . Electronically Signed   By: Myles RosenthalJohn  Stahl M.D.   On: 04/05/2016 18:23   [place] Thoracentesis Asp Pleural Space W/img Guide  Result Date: 04/06/2016 INDICATION: Patient with history of congestive heart failure presents with left pleural effusion. Request is made for diagnostic and therapeutic left thoracentesis. EXAM: ULTRASOUND GUIDED DIAGNOSTIC AND THERAPEUTIC LEFT THORACENTESIS MEDICATIONS: 10 mL 1% lidocaine COMPLICATIONS: None immediate. PROCEDURE: An ultrasound guided thoracentesis was thoroughly discussed with the patient and questions answered. The benefits, risks, alternatives and complications were also discussed. The patient understands and wishes to proceed with the procedure. Written consent was obtained. Ultrasound was performed to localize and mark a small but adequate pocket of fluid in the left chest. The area was then prepped and draped in the normal sterile fashion. 1% Lidocaine was used for local anesthesia. Under ultrasound guidance a Safe-T-centesis catheter was introduced. Thoracentesis was performed. The catheter was removed and a dressing applied. FINDINGS: A total of approximately 120 mL of hazy, yellow fluid was removed. Samples were sent to the laboratory as requested by the clinical team. IMPRESSION: Successful ultrasound guided diagnostic left thoracentesis yielding 120 mL of pleural fluid. Read by:  Loyce DysKacie Matthews  PA-C Electronically Signed   By: Simonne ComeJohn  Watts M.D.   On: 04/06/2016 16:34     Subjective:  Discharge Exam: Vitals:   04/07/16 1040 04/07/16 1400 04/07/16 2100 04/08/16 0500  BP: 131/89 116/70 (!) 114/54 115/76  Pulse: 86 79 87 (!) 59  Resp:  20 18 20   Temp:  98.6 F (37 C) 98.6 F (37 C) 97.5 F (36.4 C)  TempSrc:  Oral Oral Oral  SpO2:  96% 98% 95%  Weight:      Height:       General: Pt is alert, awake, not in acute distress Cardiovascular: RRR, S1/S2 +, no rubs, no gallops Respiratory: CTA bilaterally, no wheezing, no rhonchi Abdominal: Soft, NT, ND, bowel sounds + Extremities: no edema, no cyanosis  The results of significant diagnostics from this hospitalization (including imaging, microbiology, ancillary and laboratory) are listed below for reference.    Microbiology: Recent Results (from the past 240 hour(s))  Blood culture (routine x 2)     Status: None (Preliminary result)   Collection Time: 04/04/16 12:05 AM  Result Value Ref Range Status   Specimen Description BLOOD RIGHT ANTECUBITAL  Final   Special Requests BOTTLES DRAWN AEROBIC AND ANAEROBIC 5CC  Final   Culture NO GROWTH 3 DAYS  Final   Report Status PENDING  Incomplete  Blood culture (routine x 2)     Status: None (Preliminary result)   Collection  Time: 04/04/16 12:10 AM  Result Value Ref Range Status   Specimen Description BLOOD LEFT ANTECUBITAL  Final   Special Requests BOTTLES DRAWN AEROBIC AND ANAEROBIC 5CC  Final   Culture NO GROWTH 3 DAYS  Final   Report Status PENDING  Incomplete  Culture, blood (routine x 2)     Status: None (Preliminary result)   Collection Time: 04/05/16  6:46 PM  Result Value Ref Range Status   Specimen Description BLOOD RIGHT ANTECUBITAL  Final   Special Requests BOTTLES DRAWN AEROBIC AND ANAEROBIC 5CC  Final   Culture NO GROWTH 2 DAYS  Final   Report Status PENDING  Incomplete  Culture, blood (routine x 2)     Status: None (Preliminary result)   Collection Time:  04/05/16  7:45 PM  Result Value Ref Range Status   Specimen Description BLOOD LEFT ANTECUBITAL  Final   Special Requests IN PEDIATRIC BOTTLE 3CC  Final   Culture NO GROWTH 2 DAYS  Final   Report Status PENDING  Incomplete  Culture, body fluid-bottle     Status: None (Preliminary result)   Collection Time: 04/06/16  4:16 PM  Result Value Ref Range Status   Specimen Description FLUID LEFT PLEURAL  Final   Special Requests BOTTLES DRAWN AEROBIC AND ANAEROBIC 10CC  Final   Culture NO GROWTH < 24 HOURS  Final   Report Status PENDING  Incomplete  Gram stain     Status: None   Collection Time: 04/06/16  4:16 PM  Result Value Ref Range Status   Specimen Description FLUID LEFT PLEURAL  Final   Special Requests NONE  Final   Gram Stain   Final    ABUNDANT WBC PRESENT,BOTH PMN AND MONONUCLEAR NO ORGANISMS SEEN    Report Status 04/06/2016 FINAL  Final     Labs: BNP (last 3 results)  Recent Labs  04/06/16 1207  BNP 203.6*   Basic Metabolic Panel:  Recent Labs Lab 04/04/16 0011 04/05/16 1746 04/06/16 0315 04/07/16 0258  NA 135 135 136 137  K 4.1 4.4 3.8 4.0  CL 97* 96* 101 103  CO2 26 25 24 27   GLUCOSE 108* 122* 102* 96  BUN 13 11 6 6   CREATININE 0.93 1.00 0.78 0.78  CALCIUM 9.9 9.5 8.6* 8.7*   Liver Function Tests:  Recent Labs Lab 04/04/16 0011 04/06/16 1207  AST 18  --   ALT 21  --   ALKPHOS 64  --   BILITOT 0.8  --   PROT 7.6 6.8  ALBUMIN 4.5 3.4*   No results for input(s): LIPASE, AMYLASE in the last 168 hours. No results for input(s): AMMONIA in the last 168 hours. CBC:  Recent Labs Lab 04/04/16 0011 04/05/16 1746 04/06/16 0315 04/07/16 0258  WBC 18.9* 24.7* 20.9* 12.9*  NEUTROABS 12.1*  --  11.4*  --   HGB 15.9 14.9 13.3 12.6*  HCT 45.9 42.2 38.8* 37.3*  MCV 89.5 88.3 89.6 90.1  PLT 353 348 361 386   Cardiac Enzymes:  Recent Labs Lab 04/05/16 2317 04/06/16 0315 04/06/16 1207  TROPONINI 0.08* 0.11* 0.07*   BNP: Invalid input(s):  POCBNP CBG: No results for input(s): GLUCAP in the last 168 hours. D-Dimer  Recent Labs  04/05/16 2317  DDIMER 3.15*   Hgb A1c No results for input(s): HGBA1C in the last 72 hours. Lipid Profile No results for input(s): CHOL, HDL, LDLCALC, TRIG, CHOLHDL, LDLDIRECT in the last 72 hours. Thyroid function studies No results for input(s): TSH, T4TOTAL, T3FREE, THYROIDAB  in the last 72 hours.  Invalid input(s): FREET3 Anemia work up No results for input(s): VITAMINB12, FOLATE, FERRITIN, TIBC, IRON, RETICCTPCT in the last 72 hours. Urinalysis    Component Value Date/Time   COLORURINE STRAW (A) 04/03/2016 2345   APPEARANCEUR CLEAR 04/03/2016 2345   LABSPEC 1.006 04/03/2016 2345   PHURINE 6.0 04/03/2016 2345   GLUCOSEU NEGATIVE 04/03/2016 2345   HGBUR NEGATIVE 04/03/2016 2345   BILIRUBINUR NEGATIVE 04/03/2016 2345   KETONESUR NEGATIVE 04/03/2016 2345   PROTEINUR NEGATIVE 04/03/2016 2345   NITRITE NEGATIVE 04/03/2016 2345   LEUKOCYTESUR NEGATIVE 04/03/2016 2345   Sepsis Labs Invalid input(s): PROCALCITONIN,  WBC,  LACTICIDVEN Microbiology Recent Results (from the past 240 hour(s))  Blood culture (routine x 2)     Status: None (Preliminary result)   Collection Time: 04/04/16 12:05 AM  Result Value Ref Range Status   Specimen Description BLOOD RIGHT ANTECUBITAL  Final   Special Requests BOTTLES DRAWN AEROBIC AND ANAEROBIC 5CC  Final   Culture NO GROWTH 3 DAYS  Final   Report Status PENDING  Incomplete  Blood culture (routine x 2)     Status: None (Preliminary result)   Collection Time: 04/04/16 12:10 AM  Result Value Ref Range Status   Specimen Description BLOOD LEFT ANTECUBITAL  Final   Special Requests BOTTLES DRAWN AEROBIC AND ANAEROBIC 5CC  Final   Culture NO GROWTH 3 DAYS  Final   Report Status PENDING  Incomplete  Culture, blood (routine x 2)     Status: None (Preliminary result)   Collection Time: 04/05/16  6:46 PM  Result Value Ref Range Status   Specimen  Description BLOOD RIGHT ANTECUBITAL  Final   Special Requests BOTTLES DRAWN AEROBIC AND ANAEROBIC 5CC  Final   Culture NO GROWTH 2 DAYS  Final   Report Status PENDING  Incomplete  Culture, blood (routine x 2)     Status: None (Preliminary result)   Collection Time: 04/05/16  7:45 PM  Result Value Ref Range Status   Specimen Description BLOOD LEFT ANTECUBITAL  Final   Special Requests IN PEDIATRIC BOTTLE 3CC  Final   Culture NO GROWTH 2 DAYS  Final   Report Status PENDING  Incomplete  Culture, body fluid-bottle     Status: None (Preliminary result)   Collection Time: 04/06/16  4:16 PM  Result Value Ref Range Status   Specimen Description FLUID LEFT PLEURAL  Final   Special Requests BOTTLES DRAWN AEROBIC AND ANAEROBIC 10CC  Final   Culture NO GROWTH < 24 HOURS  Final   Report Status PENDING  Incomplete  Gram stain     Status: None   Collection Time: 04/06/16  4:16 PM  Result Value Ref Range Status   Specimen Description FLUID LEFT PLEURAL  Final   Special Requests NONE  Final   Gram Stain   Final    ABUNDANT WBC PRESENT,BOTH PMN AND MONONUCLEAR NO ORGANISMS SEEN    Report Status 04/06/2016 FINAL  Final     Time coordinating discharge: Over 30 minutes  SIGNED:   Clint Lipps, MD  [facility] 04/08/2016, 10:42 AM Pager   If 7PM-7AM, please contact night-coverage [email or web address] Password [email or web address]

## 2016-04-09 LAB — CULTURE, BLOOD (ROUTINE X 2)
Culture: NO GROWTH
Culture: NO GROWTH

## 2016-04-10 LAB — CULTURE, BLOOD (ROUTINE X 2)
Culture: NO GROWTH
Culture: NO GROWTH

## 2016-04-11 LAB — CULTURE, BODY FLUID-BOTTLE

## 2016-04-11 LAB — CULTURE, BODY FLUID W GRAM STAIN -BOTTLE: Culture: NO GROWTH

## 2016-04-20 NOTE — Progress Notes (Deleted)
Cardiology Office Note    Date:  04/20/2016   ID:  William Rubio, DOB [birth year], MRN [id number]  PCP:  Default, Provider, MD  Cardiologist: Dr. Excell Seltzer   Chief Complaint: Hospital follow up for chest pain   History of Present Illness:   William Rubio is a 41 y.o. male CAD s/p CAD, ICM s/p ICM, Chronic systolic CHF and HLD presents for hospital follow up.   His cardiac history is notable for coronary stent initially place 13-15 years ago in [facility] [place]. He underwent 2 vessel CABG with a RIMA to LAD and LIMA to the PDA. There were no vein grafts utilized. He's had an ischemic cardiomyopathy and underwent ICD implantation in November 2017. This was note in *** . Records not scanned in system yet.   Admitted 04/05/16-04/08/16 for sepsis and CAP with parapneumonic effusion s/p left thoracentesis.Treated with Abx. Seen by our team due to elevated troponin of 0.11. Flat trend. Felt demand ischemia in setting of acute illness. Above records reviewed by Dr. Excell Seltzer. Discharged home on levofloxacin 750 mg for 5 more days.    Past Medical History:  Diagnosis Date  . AICD (automatic cardioverter/defibrillator) present 2018  . CHF (congestive heart failure) (HCC)   . Coronary artery disease   . Hyperlipidemia     Past Surgical History:  Procedure Laterality Date  . CORONARY ARTERY BYPASS GRAFT    . CORONARY STENT PLACEMENT    . SPLENECTOMY, TOTAL      Current Medications: Prior to Admission medications   Medication Sig Start Date End Date Taking? Authorizing Provider  aspirin EC 81 MG tablet Take 81 mg by mouth daily.    Historical Provider, MD  furosemide (LASIX) 20 MG tablet Take 20 mg by mouth daily. 02/06/16   Historical Provider, MD  levofloxacin (LEVAQUIN) 750 MG tablet Take 1 tablet (750 mg total) by mouth daily. X 7 days 04/08/16   Clydia Llano, MD  lisinopril (PRINIVIL,ZESTRIL) 20 MG tablet Take 20 mg by mouth daily. 02/06/16   Historical Provider, MD  metoprolol  (LOPRESSOR) 50 MG tablet Take 50 mg by mouth 2 (two) times daily. 02/06/16   Historical Provider, MD  omeprazole (PRILOSEC OTC) 20 MG tablet Take 20 mg by mouth daily.    Historical Provider, MD  pravastatin (PRAVACHOL) 80 MG tablet Take 80 mg by mouth daily. 02/09/16   Historical Provider, MD  traMADol (ULTRAM) 50 MG tablet Take 1 tablet (50 mg total) by mouth every 6 (six) hours as needed. 04/04/16   Charlynne Pander, MD    Allergies:   Penicillins and Other   Social History   Social History  . Marital status: Single    Spouse name: N/A  . Number of children: N/A  . Years of education: N/A   Social History Main Topics  . Smoking status: Current Every Day Smoker    Types: E-cigarettes  . Smokeless tobacco: Never Used  . Alcohol use No  . Drug use: No  . Sexual activity: Not on file   Other Topics Concern  . Not on file   Social History Narrative  . No narrative on file     Family History:  The patient's family history is not on file. ***  ROS:   Please see the history of present illness.    ROS All other systems reviewed and are negative.   PHYSICAL EXAM:   VS:  There were no vitals taken for this visit.   GEN: Well nourished, well developed, in  no acute distress  HEENT: normal  Neck: no JVD, carotid bruits, or masses Cardiac: ***RRR; no murmurs, rubs, or gallops,no edema  Respiratory:  clear to auscultation bilaterally, normal work of breathing GI: soft, nontender, nondistended, + BS MS: no deformity or atrophy  Skin: warm and dry, no rash Neuro:  Alert and Oriented x 3, Strength and sensation are intact Psych: euthymic mood, full affect  Wt Readings from Last 3 Encounters:  04/05/16 270 lb 1.6 oz (122.5 kg)  04/03/16 265 lb 4 oz (120.3 kg)      Studies/Labs Reviewed:   EKG:  EKG is ordered today.  The ekg ordered today demonstrates ***  Recent Labs: 04/04/2016: ALT 21 04/06/2016: B Natriuretic Peptide 203.6 04/07/2016: BUN 6; Creatinine, Ser 0.78;  Hemoglobin 12.6; Platelets 386; Potassium 4.0; Sodium 137   Lipid Panel No results found for: CHOL, TRIG, HDL, CHOLHDL, VLDL, LDLCALC, LDLDIRECT  Additional studies/ records that were reviewed today include:   As above    ASSESSMENT & PLAN:    1. CAD s/p CABG  2. ICM s/p ICD  3. Chronic systolic CHF  4. HTN  5. HLD    Medication Adjustments/Labs and Tests Ordered: Current medicines are reviewed at length with the patient today.  Concerns regarding medicines are outlined above.  Medication changes, Labs and Tests ordered today are listed in the Patient Instructions below. There are no Patient Instructions on file for this visit.   Lorelei Pont, [place]  04/20/2016 4:30 PM    [facility] [address] [place], [place], [place]  [address] Phone: [phone]; Fax: [phone]

## 2016-04-21 ENCOUNTER — Telehealth: Payer: Self-pay

## 2016-04-21 ENCOUNTER — Ambulatory Visit: Payer: BLUE CROSS/BLUE SHIELD | Admitting: Physician Assistant

## 2016-04-21 NOTE — Telephone Encounter (Signed)
Patient came in to be seen today by Shanda Howells. His insurance was checked at registration and they do not cover the office visits here. He is canceling his appointment and going back to [place] to have care there.    Routing to both Dr. Katrinka Blazing and Dr. Excell Seltzer.

## 2016-06-03 ENCOUNTER — Ambulatory Visit: Payer: BLUE CROSS/BLUE SHIELD | Admitting: Family Medicine

## 2016-06-08 ENCOUNTER — Ambulatory Visit (INDEPENDENT_AMBULATORY_CARE_PROVIDER_SITE_OTHER): Payer: BLUE CROSS/BLUE SHIELD | Admitting: Family Medicine

## 2016-06-08 ENCOUNTER — Ambulatory Visit (INDEPENDENT_AMBULATORY_CARE_PROVIDER_SITE_OTHER): Payer: BLUE CROSS/BLUE SHIELD

## 2016-06-08 ENCOUNTER — Encounter: Payer: Self-pay | Admitting: Family Medicine

## 2016-06-08 VITALS — BP 128/82 | HR 70 | Temp 98.2°F | Ht 70.0 in | Wt 269.8 lb

## 2016-06-08 DIAGNOSIS — G4730 Sleep apnea, unspecified: Secondary | ICD-10-CM

## 2016-06-08 DIAGNOSIS — R059 Cough, unspecified: Secondary | ICD-10-CM

## 2016-06-08 DIAGNOSIS — K219 Gastro-esophageal reflux disease without esophagitis: Secondary | ICD-10-CM | POA: Diagnosis not present

## 2016-06-08 DIAGNOSIS — R5383 Other fatigue: Secondary | ICD-10-CM | POA: Diagnosis not present

## 2016-06-08 DIAGNOSIS — R05 Cough: Secondary | ICD-10-CM

## 2016-06-08 DIAGNOSIS — I5022 Chronic systolic (congestive) heart failure: Secondary | ICD-10-CM

## 2016-06-08 NOTE — Progress Notes (Signed)
William Rubio is a 41 y.o. male [facility]   Patient Care Team: William Rubio, William Tumlin, DO as PCP - General (Family Medicine) William Rubio, William T, PA-C as Physician Assistant (Cardiology)   History of Present Illness:   William Rubio, CMA, acting as scribe for Dr. Earlene PlaterWallace.  CC: Patient comes in to establish care today.  States he has been having a lot of fatigue for about 3 weeks.  Per patient, he can sleep all night and still feel like he hasn'Rubio slept.  States he thinks he snores.  He has been told in the past that he "stops breathing" in his sleep.  Patient with a complicated medical history.  Medications and allergies have been reviewed.  HPI:  1. Left-sided pleuritic pain. With minimal cough. Patient is a 41 year old man with a past medical history significant for coronary artery disease status post CABG, CHF with a self-reported EF around 30%, and status post AICD. He was hospitalized in March for community-acquired pneumonia. This was complicated by a parapneumonic effusion. The patient states that he feels that this may be beginning again. He does feel more fatigued than normal. He has a slight cough. He denies any overt chest pain, shortness of breath, wheeze, headaches, dizziness, or edema. She has an upcoming appointment with a cardiologist. I do not have records from his previous cardiologist will request those.   2. Fatigue, unspecified type.The patient describes daytime somnolence. He has no drive. He feels like sleeping all day.     3. Sleep-disordered breathing.The patient has been told that he stops breathing at night.    Health Maintenance Due  Topic Date Due  . TETANUS/TDAP  09/05/1992   PMHx, SurgHx, SocialHx, Medications, and Allergies were reviewed in the Visit Navigator and updated as appropriate.   Past Medical History:  Diagnosis Date  . AICD (automatic cardioverter/defibrillator) present 2018  . Asplenia   . CHF (congestive heart failure) (HCC)   .  Coronary artery disease   . GERD (gastroesophageal reflux disease)   . Hypertension   . Myocardial infarction [facility](HCC) 2006   Past Surgical History:  Procedure Laterality Date  . CORONARY ARTERY BYPASS GRAFT    . CORONARY STENT PLACEMENT    . SPLENECTOMY, TOTAL     Family History  Problem Relation Age of Onset  . Heart disease Mother   . Hypertension Mother   . Stroke Father   . Heart disease Maternal Grandmother   . Hypertension Maternal Grandmother   . Stroke Maternal Grandmother   . Heart disease Maternal Grandfather   . Hypertension Maternal Grandfather   . Stroke Paternal Grandfather   . Hyperlipidemia Brother   . Hypertension Brother    Social History  Substance Use Topics  . Smoking status: Former Smoker    Types: Cigarettes  . Smokeless tobacco: Never Used  . Alcohol use Yes     Comment: occasional   Current Medications and Allergies:   .  aspirin EC 81 MG tablet, Take 81 mg by mouth daily., Disp: , Rfl:  .  furosemide (LASIX) 20 MG tablet, Take 20 mg by mouth daily., Disp: , Rfl: 0 .  lisinopril (PRINIVIL,ZESTRIL) 20 MG tablet, Take 20 mg by mouth daily., Disp: , Rfl: 0 .  metoprolol (LOPRESSOR) 50 MG tablet, Take 50 mg by mouth 2 (two) times daily., Disp: , Rfl: 0 .  pravastatin (PRAVACHOL) 80 MG tablet, Take 80 mg by mouth daily., Disp: , Rfl: 0 .  ranitidine (ZANTAC) 150 MG  capsule, Take 150 mg by mouth 2 (two) times daily., Disp: , Rfl:   Allergies  Allergen Reactions  . Penicillins Rash    Rash as both a kid and as an adult  . Other     Bee stings  . Latex Rash   Review of Systems:   Review of Systems  Constitutional: Positive for malaise/fatigue. Negative for chills and fever.  HENT: Negative for congestion, ear pain and sore throat.   Eyes: Negative for blurred vision and pain.  Respiratory: Negative for cough and shortness of breath.   Cardiovascular: Negative for chest pain and palpitations.  Gastrointestinal: Negative for abdominal pain,  nausea and vomiting.  Genitourinary: Negative for frequency.  Musculoskeletal: Negative for back pain and neck pain.  Skin: Negative for rash.  Neurological: Negative for dizziness, loss of consciousness, weakness and headaches.  Endo/Heme/Allergies: Does not bruise/bleed easily.  Psychiatric/Behavioral: Negative for depression. The patient is not nervous/anxious.    Vitals:   Vitals:   06/08/16 1446  BP: 128/82  Pulse: 70  Temp: 98.2 F (36.8 C)  TempSrc: Oral  SpO2: 95%  Weight: 269 lb 12.8 oz (122.4 kg)  Height: 5\' 10"  (1.778 m)     Body mass index is 38.71 kg/m.  Physical Exam:   Physical Exam  Constitutional: He is oriented to person, place, and time. He appears well-developed and well-nourished. No distress.  HENT:  Head: Normocephalic and atraumatic.  Right Ear: External ear normal.  Left Ear: External ear normal.  Nose: Nose normal.  Mouth/Throat: Oropharynx is clear and moist.  Eyes: Conjunctivae and EOM are normal. Pupils are equal, round, and reactive to light.  Neck: Normal range of motion. Neck supple.  Cardiovascular: Normal rate, regular rhythm, normal heart sounds and intact distal pulses.   Pulmonary/Chest: Effort normal.  Abdominal: Soft. Bowel sounds are normal.  Musculoskeletal: Normal range of motion.  Neurological: He is alert and oriented to person, place, and time.  Skin: Skin is warm and dry.  Psychiatric: He has a normal mood and affect. His behavior is normal. Judgment and thought content normal.  Nursing note and vitals reviewed.  EXAM: CHEST  2 VIEW  COMPARISON:  04/06/2016  FINDINGS: The heart size and mediastinal contours are within normal limits. Prior CABG. Single lead transvenous pacemaker in appropriate position.  Both lungs are clear. Previously seen opacity in the left retrocardiac lung base has resolved. No evidence of pleural effusion or pneumothorax. The visualized skeletal structures  are unremarkable.  IMPRESSION: No active cardiopulmonary disease.  Assessment and Plan:   William Rubio was seen today for establish care and fatigue.  Diagnoses and all orders for this visit:  Cough Comments: The patient has had pneumonia recently. He feels that he is developing pleuritic pain his left side. Chest x-ray is reassuring as are his vitals and exam today. We reviewed red flags at length as well as reasons for going to the emergency room.  Orders: -     DG Chest 2 View -     CBC with Differential/Platelet -     Comprehensive metabolic panel -     Ambulatory referral to Pulmonology  Fatigue, unspecified type Comments: Likely multifactorial. Patient has a history of congestive heart failure. He had pneumonia recently. Sleep study pending per below. Orders: -     Nocturnal polysomnography (NPSG); Future  Sleep-disordered breathing Comments: The patient has had witnessed apneic episodes. I will order a sleep study. Orders: -     Nocturnal polysomnography (NPSG); Future  Morbid obesity (HCC) Comments: The patient is asked to make an attempt to improve diet and exercise patterns to aid in medical management of this problem.  Chronic systolic heart failure (HCC) Comments: Appointment with [facility] in 2 weeks. Per patient, EF 25-30%. Records requested.  Gastroesophageal reflux disease, esophagitis presence not specified Comments: Medication refill today.    . Reviewed expectations re: course of current medical issues. . Discussed self-management of symptoms. . Outlined signs and symptoms indicating need for more acute intervention. . Patient verbalized understanding and all questions were answered. Marland Kitchen Health Maintenance issues including appropriate healthy diet, exercise, and smoking avoidance were discussed with patient. . See orders for this visit as documented in the electronic medical record. . Patient received an After Visit Summary.  CMA served as Neurosurgeon  during this visit. History, Physical, and Plan performed by medical provider. The above documentation has been reviewed and is accurate and complete. William Rima, D.O.  William Rima, DO [place] [place] 06/11/2016  Future Appointments Date Time Provider Department Center  06/18/2016 9:15 AM Beatrice Lecher, PA-C [facility]

## 2016-06-09 ENCOUNTER — Emergency Department (HOSPITAL_COMMUNITY): Payer: BLUE CROSS/BLUE SHIELD

## 2016-06-09 ENCOUNTER — Emergency Department (HOSPITAL_COMMUNITY)
Admission: EM | Admit: 2016-06-09 | Discharge: 2016-06-09 | Disposition: A | Discharge status: Home / Self Care | Payer: BLUE CROSS/BLUE SHIELD | Attending: Emergency Medicine | Admitting: Emergency Medicine

## 2016-06-09 ENCOUNTER — Encounter: Payer: Self-pay | Admitting: Family Medicine

## 2016-06-09 ENCOUNTER — Encounter (HOSPITAL_COMMUNITY): Payer: Self-pay | Admitting: Emergency Medicine

## 2016-06-09 ENCOUNTER — Other Ambulatory Visit: Payer: Self-pay

## 2016-06-09 ENCOUNTER — Telehealth: Payer: Self-pay

## 2016-06-09 DIAGNOSIS — Z91040 Latex allergy status: Secondary | ICD-10-CM | POA: Insufficient documentation

## 2016-06-09 DIAGNOSIS — Z7982 Long term (current) use of aspirin: Secondary | ICD-10-CM | POA: Insufficient documentation

## 2016-06-09 DIAGNOSIS — I110 Hypertensive heart disease with heart failure: Secondary | ICD-10-CM | POA: Diagnosis not present

## 2016-06-09 DIAGNOSIS — Z87891 Personal history of nicotine dependence: Secondary | ICD-10-CM | POA: Insufficient documentation

## 2016-06-09 DIAGNOSIS — Z880 Allergy status to penicillin: Secondary | ICD-10-CM | POA: Insufficient documentation

## 2016-06-09 DIAGNOSIS — I252 Old myocardial infarction: Secondary | ICD-10-CM | POA: Insufficient documentation

## 2016-06-09 DIAGNOSIS — I509 Heart failure, unspecified: Secondary | ICD-10-CM | POA: Diagnosis not present

## 2016-06-09 DIAGNOSIS — R079 Chest pain, unspecified: Secondary | ICD-10-CM

## 2016-06-09 DIAGNOSIS — Z79899 Other long term (current) drug therapy: Secondary | ICD-10-CM | POA: Diagnosis not present

## 2016-06-09 DIAGNOSIS — I309 Acute pericarditis, unspecified: Secondary | ICD-10-CM | POA: Insufficient documentation

## 2016-06-09 DIAGNOSIS — R0789 Other chest pain: Secondary | ICD-10-CM | POA: Diagnosis present

## 2016-06-09 DIAGNOSIS — I25810 Atherosclerosis of coronary artery bypass graft(s) without angina pectoris: Secondary | ICD-10-CM | POA: Diagnosis not present

## 2016-06-09 LAB — COMPREHENSIVE METABOLIC PANEL
ALT: 79 U/L — ABNORMAL HIGH (ref 0–53)
AST: 26 U/L (ref 0–37)
Albumin: 5 g/dL (ref 3.5–5.2)
Alkaline Phosphatase: 125 U/L — ABNORMAL HIGH (ref 39–117)
BUN: 17 mg/dL (ref 6–23)
CO2: 27 mEq/L (ref 19–32)
Calcium: 10.4 mg/dL (ref 8.4–10.5)
Chloride: 100 mEq/L (ref 96–112)
Creatinine, Ser: 0.87 mg/dL (ref 0.40–1.50)
GFR: 101.91 mL/min (ref >60.00)
Glucose, Bld: 101 mg/dL — ABNORMAL HIGH (ref 70–99)
Potassium: 4.7 mEq/L (ref 3.5–5.1)
Sodium: 135 mEq/L (ref 135–145)
Total Bilirubin: 0.6 mg/dL (ref 0.2–1.2)
Total Protein: 8.6 g/dL — ABNORMAL HIGH (ref 6.0–8.3)

## 2016-06-09 LAB — CBC WITH DIFFERENTIAL/PLATELET
Basophils Absolute: 0.1 10*3/uL (ref 0.0–0.1)
Basophils Absolute: 0.2 10*3/uL — ABNORMAL HIGH (ref 0.0–0.1)
Basophils Relative: 1.1 % (ref 0.0–3.0)
Basophils Relative: 1 %
Eosinophils Absolute: 0.3 10*3/uL (ref 0.0–0.7)
Eosinophils Absolute: 0.1 10*3/uL (ref 0.0–0.7)
Eosinophils Relative: 2.3 % (ref 0.0–5.0)
Eosinophils Relative: 0 %
HCT: 47.7 % (ref 39.0–52.0)
HCT: 44.9 % (ref 39.0–52.0)
Hemoglobin: 16.3 g/dL (ref 13.0–17.0)
Hemoglobin: 15.2 g/dL (ref 13.0–17.0)
Lymphocytes Relative: 33.3 % (ref 12.0–46.0)
Lymphocytes Relative: 28 %
Lymphs Abs: 4.1 10*3/uL — ABNORMAL HIGH (ref 0.7–4.0)
Lymphs Abs: 5.6 10*3/uL — ABNORMAL HIGH (ref 0.7–4.0)
MCH: 30.9 pg (ref 26.0–34.0)
MCHC: 34.3 g/dL (ref 30.0–36.0)
MCHC: 33.9 g/dL (ref 30.0–36.0)
MCV: 92.1 fl (ref 78.0–100.0)
MCV: 91.3 fL (ref 78.0–100.0)
Monocytes Absolute: 1.5 10*3/uL — ABNORMAL HIGH (ref 0.1–1.0)
Monocytes Absolute: 3.2 10*3/uL — ABNORMAL HIGH (ref 0.1–1.0)
Monocytes Relative: 12.1 % — ABNORMAL HIGH (ref 3.0–12.0)
Monocytes Relative: 16 %
Neutro Abs: 6.3 10*3/uL (ref 1.4–7.7)
Neutro Abs: 11.3 10*3/uL — ABNORMAL HIGH (ref 1.7–7.7)
Neutrophils Relative %: 51.2 % (ref 43.0–77.0)
Neutrophils Relative %: 55 %
Platelets: 445 10*3/uL — ABNORMAL HIGH (ref 150.0–400.0)
Platelets: 392 10*3/uL (ref 150–400)
RBC: 5.18 Mil/uL (ref 4.22–5.81)
RBC: 4.92 MIL/uL (ref 4.22–5.81)
RDW: 14.3 % (ref 11.5–15.5)
RDW: 14.4 % (ref 11.5–15.5)
WBC: 12.4 10*3/uL — ABNORMAL HIGH (ref 4.0–10.5)
WBC: 20.3 10*3/uL — ABNORMAL HIGH (ref 4.0–10.5)

## 2016-06-09 LAB — BASIC METABOLIC PANEL
Anion gap: 11 (ref 5–15)
BUN: 15 mg/dL (ref 6–20)
CO2: 23 mmol/L (ref 22–32)
Calcium: 9.7 mg/dL (ref 8.9–10.3)
Chloride: 100 mmol/L — ABNORMAL LOW (ref 101–111)
Creatinine, Ser: 0.79 mg/dL (ref 0.61–1.24)
GFR calc Af Amer: >60 mL/min (ref >60)
GFR calc non Af Amer: >60 mL/min (ref >60)
Glucose, Bld: 112 mg/dL — ABNORMAL HIGH (ref 65–99)
Potassium: 4.5 mmol/L (ref 3.5–5.1)
Sodium: 134 mmol/L — ABNORMAL LOW (ref 135–145)

## 2016-06-09 LAB — I-STAT TROPONIN, ED
TROPONIN I, POC: 0.01 ng/mL (ref 0.00–0.08)
Troponin i, poc: 0 ng/mL (ref 0.00–0.08)

## 2016-06-09 LAB — CBG MONITORING, ED: Glucose-Capillary: 109 mg/dL — ABNORMAL HIGH (ref 65–99)

## 2016-06-09 MED ORDER — KETOROLAC TROMETHAMINE 30 MG/ML IJ SOLN
30.0000 mg | Freq: Once | INTRAMUSCULAR | Status: AC
  Administered 2016-06-09: 30 mg via INTRAVENOUS
  Filled 2016-06-09: qty 1

## 2016-06-09 MED ORDER — DOXYCYCLINE HYCLATE 100 MG PO TABS: 100.0000 mg | ORAL_TABLET | Freq: Two times a day (BID) | ORAL | 0 refills | Status: DC

## 2016-06-09 MED ORDER — MORPHINE SULFATE (PF) 4 MG/ML IV SOLN
4.0000 mg | Freq: Once | INTRAVENOUS | Status: AC
  Administered 2016-06-09: 4 mg via INTRAVENOUS
  Filled 2016-06-09: qty 1

## 2016-06-09 MED ORDER — FENTANYL CITRATE (PF) 100 MCG/2ML IJ SOLN
50.0000 ug | Freq: Once | INTRAMUSCULAR | Status: AC
  Administered 2016-06-09: 50 ug via RESPIRATORY_TRACT

## 2016-06-09 MED ORDER — TRAMADOL HCL 50 MG PO TABS: 50.0000 mg | ORAL_TABLET | Freq: Three times a day (TID) | ORAL | 0 refills | Status: DC | PRN

## 2016-06-09 MED ORDER — FENTANYL CITRATE (PF) 100 MCG/2ML IJ SOLN
INTRAMUSCULAR | Status: AC
  Filled 2016-06-09: qty 2

## 2016-06-09 MED ORDER — DEXAMETHASONE 4 MG PO TABS
10.0000 mg | ORAL_TABLET | Freq: Once | ORAL | Status: AC
  Administered 2016-06-09: 10 mg via ORAL
  Filled 2016-06-09: qty 3

## 2016-06-09 MED ORDER — ONDANSETRON HCL 4 MG/2ML IJ SOLN
4.0000 mg | Freq: Once | INTRAMUSCULAR | Status: AC
  Administered 2016-06-09: 4 mg via INTRAVENOUS
  Filled 2016-06-09: qty 2

## 2016-06-09 MED ORDER — IOPAMIDOL (ISOVUE-370) INJECTION 76%
INTRAVENOUS | Status: AC
  Administered 2016-06-09: 100 mL
  Filled 2016-06-09: qty 100

## 2016-06-09 MED ORDER — NAPROXEN 500 MG PO TABS: 500.0000 mg | ORAL_TABLET | Freq: Two times a day (BID) | ORAL | 0 refills | Status: DC

## 2016-06-09 MED ORDER — MORPHINE SULFATE (PF) 4 MG/ML IV SOLN
4.0000 mg | Freq: Once | INTRAVENOUS | Status: DC
  Filled 2016-06-09: qty 1

## 2016-06-09 NOTE — Telephone Encounter (Signed)
Per Dr. Earlene PlaterWallace, Rx for doxy sent to patient's pharmacy.  Rx for tramadol called in to patient's pharmacy.  Patient notified and verbalized understanding.  Still awaiting lab results from yesterday.  Will call patient when results come back.

## 2016-06-09 NOTE — ED Notes (Signed)
Patient complaining of severe rib pain on reassessment. Adult triage pain protocol initiated, see MAR

## 2016-06-09 NOTE — Discharge Instructions (Signed)
Please read attached information. If you experience any new or worsening signs or symptoms please return to the emergency room for evaluation. Please follow-up with your primary care provider or specialist as discussed. Please use medication prescribed only as directed and discontinue taking if you have any concerning signs or symptoms.   °

## 2016-06-09 NOTE — ED Notes (Signed)
Pt walking around in room. Pt states his pain is worse when he takes a deep breath. Pt advised to stay in bed

## 2016-06-09 NOTE — ED Triage Notes (Signed)
Pt sts left rib area pain; pt sts feels same as when had PNA; pt sts hx of MI in past

## 2016-06-09 NOTE — ED Notes (Signed)
ED Provider at bedside. 

## 2016-06-09 NOTE — ED Provider Notes (Signed)
[facility]-EMERGENCY DEPT Provider Note   CSN: [id number] Arrival date & time: 06/09/16  1154   History   Chief Complaint Chief Complaint  Patient presents with  . rib pain    HPI William Rubio is a 41 y.o. male.  HPI   41 year old male with a past medical history of hyperlipidemia, CAD status post CABG, CHF, and status post AICD presents today with complaints of left rib pain. Patient notes a history of left-sided pneumonia in which she was treated for sepsis and hospitalized, he was discharged on 04/08/2016. Patient notes since then he had been feeling well without chest pain or infectious etiology. He does note that he has been more short of breath with exercise since hospital discharge, often has random sweating throughout the day. He notes yesterday he had a very small pain in his left lateral rib, this was nonsevere. He notes that he saw his primary care which showed no signs of infectious etiology and is lungs. He notes symptoms dramatically worsen last night with sharp stabbing pain in the left lateral ribs worse with inspiration/ worse with laying back, he notes shortness of breath worse with ambulation, some shortness of breath at rest. He notes subjective fever last night. He reports dry nonproductive cough. He notes this feels identical to previous episode of pneumonia. Patient denies any history DVT or PE, denies any lower extremity swelling or edema, or any other significant risk factors for PE or DVT. Patient denies back pain, or any urinary complaints.   Past Medical History:  Diagnosis Date  . AICD (automatic cardioverter/defibrillator) present 2018  . Asplenia   . CHF (congestive heart failure) (HCC)   . Coronary artery disease   . GERD (gastroesophageal reflux disease)   . Hypertension   . Myocardial infarction [facility]) 2006    Patient Active Problem List   Diagnosis Date Noted  . CAD (coronary artery disease) of artery bypass graft 04/05/2016  . Chronic systolic heart  failure (HCC) 13/24/4010  . Pleurisy with effusion 04/05/2016  . ST elevation myocardial infarction involving left circumflex coronary artery [facility])     Past Surgical History:  Procedure Laterality Date  . CORONARY ARTERY BYPASS GRAFT    . CORONARY STENT PLACEMENT    . SPLENECTOMY, TOTAL        Home Medications    Prior to Admission medications   Medication Sig Start Date End Date Taking? Authorizing Provider  aspirin EC 81 MG tablet Take 81 mg by mouth daily.    [provider]  doxycycline (VIBRA-TABS) 100 MG tablet Take 1 tablet (100 mg total) by mouth 2 (two) times daily. 06/09/16   Helane Rima, DO  furosemide (LASIX) 20 MG tablet Take 20 mg by mouth daily. 02/06/16   [provider]  lisinopril (PRINIVIL,ZESTRIL) 20 MG tablet Take 20 mg by mouth daily. 02/06/16   [provider]  metoprolol (LOPRESSOR) 50 MG tablet Take 50 mg by mouth 2 (two) times daily. 02/06/16   [provider]  naproxen (NAPROSYN) 500 MG tablet Take 1 tablet (500 mg total) by mouth 2 (two) times daily. 06/09/16   Jessic Standifer, Tinnie Gens, PA-C  pravastatin (PRAVACHOL) 80 MG tablet Take 80 mg by mouth daily. 02/09/16   [provider]  ranitidine (ZANTAC) 150 MG capsule Take 150 mg by mouth 2 (two) times daily.    [provider]  traMADol (ULTRAM) 50 MG tablet Take 1 tablet (50 mg total) by mouth every 8 (eight) hours as needed. 06/09/16   Earlene Plater,  Alcario Drought, DO    Family History Family History  Problem Relation Age of Onset  . Heart disease Mother   . Hypertension Mother   . Stroke Father   . Heart disease Maternal Grandmother   . Hypertension Maternal Grandmother   . Stroke Maternal Grandmother   . Heart disease Maternal Grandfather   . Hypertension Maternal Grandfather   . Stroke Paternal Grandfather   . Hyperlipidemia Brother   . Hypertension Brother     Social History Social History  Substance Use Topics  . Smoking status: Former Smoker    Types:  Cigarettes  . Smokeless tobacco: Never Used  . Alcohol use Yes     Comment: occasional     Allergies   Penicillins; Other; and Latex   Review of Systems Review of Systems  All other systems reviewed and are negative.    Physical Exam Updated Vital Signs BP (!) 101/54   Pulse 83   Temp 99.2 F (37.3 C) (Oral)   Resp (!) 24   SpO2 95%   Physical Exam  Constitutional: He is oriented to person, place, and time. He appears well-developed and well-nourished.  HENT:  Head: Normocephalic and atraumatic.  Eyes: Conjunctivae are normal. Pupils are equal, round, and reactive to light. Right eye exhibits no discharge. Left eye exhibits no discharge. No scleral icterus.  Neck: Normal range of motion. No JVD present. No tracheal deviation present.  Cardiovascular: Normal rate, regular rhythm, normal heart sounds and intact distal pulses.  Exam reveals no friction rub.   No murmur heard. Pulmonary/Chest: Effort normal and breath sounds normal. No stridor. No respiratory distress. He has no wheezes. He has no rales. He exhibits no tenderness.  Pain with inspiration   Abdominal: Soft. He exhibits no distension. There is no tenderness.  Musculoskeletal: He exhibits no edema.  No LE edema  Neurological: He is alert and oriented to person, place, and time. Coordination normal.  Psychiatric: He has a normal mood and affect. His behavior is normal. Judgment and thought content normal.  Nursing note and vitals reviewed.    ED Treatments / Results  Labs (all labs ordered are listed, but only abnormal results are displayed) Labs Reviewed  CBC WITH DIFFERENTIAL/PLATELET - Abnormal; Notable for the following:       Result Value   WBC 20.3 (*)    Neutro Abs 11.3 (*)    Lymphs Abs 5.6 (*)    Monocytes Absolute 3.2 (*)    Basophils Absolute 0.2 (*)    All other components within normal limits  BASIC METABOLIC PANEL - Abnormal; Notable for the following:    Sodium 134 (*)    Chloride 100  (*)    Glucose, Bld 112 (*)    All other components within normal limits  CBG MONITORING, ED - Abnormal; Notable for the following:    Glucose-Capillary 109 (*)    All other components within normal limits  I-STAT TROPOININ, ED  I-STAT TROPOININ, ED    EKG  EKG Interpretation  Date/Time:  Wednesday Jun 09 2016 12:11:12 EDT Ventricular Rate:  107 PR Interval:  196 QRS Duration: 98 QT Interval:  336 QTC Calculation: 448 R Axis:   23 Text Interpretation:  Sinus tachycardia RSR' or QR pattern in V1 suggests right ventricular conduction delay Septal infarct , age undetermined Abnormal ECG No acute changes Confirmed by Derwood Kaplan [phone]) on 06/09/2016 6:42:25 PM       Radiology Dg Chest 2 View  Result Date: 06/09/2016 CLINICAL DATA:  Pain in the left chest.  Symptoms for 2 weeks EXAM: CHEST  2 VIEW COMPARISON:  06/08/2016 FINDINGS: Low volume chest with bandlike opacities over the left more than right diaphragm. There is chronic cardiomegaly. Stable single chamber pacer positioning into the right ventricle. Prior median sternotomy. Spondylosis and disc degeneration. IMPRESSION: 1. Low volume chest with atelectatic type opacities at the bases. 2. Cardiomegaly. Electronically Signed   By: Marnee Spring M.D.   On: 06/09/2016 12:55   Dg Chest 2 View  Result Date: 06/08/2016 CLINICAL DATA:  Intermittent cough and fever.  Recent pneumonia. EXAM: CHEST  2 VIEW COMPARISON:  04/06/2016 FINDINGS: The heart size and mediastinal contours are within normal limits. Prior CABG. Single lead transvenous pacemaker in appropriate position. Both lungs are clear. Previously seen opacity in the left retrocardiac lung base has resolved. No evidence of pleural effusion or pneumothorax. The visualized skeletal structures are unremarkable. IMPRESSION: No active cardiopulmonary disease. Electronically Signed   By: Myles Rosenthal M.D.   On: 06/08/2016 17:13   Ct Angio Chest Pe W Or Wo Contrast  Result Date:  06/09/2016 CLINICAL DATA:  41 y/o M; worsening shortness of breath over 3 weeks and left-sided pain. EXAM: CT ANGIOGRAPHY CHEST WITH CONTRAST TECHNIQUE: Multidetector CT imaging of the chest was performed using the standard protocol during bolus administration of intravenous contrast. Multiplanar CT image reconstructions and MIPs were obtained to evaluate the vascular anatomy. CONTRAST:  75 cc Isovue 370 COMPARISON:  04/06/2016 CT angiogram of the chest. FINDINGS: Cardiovascular: Mild cardiomegaly. Single lead AICD with leads in the right ventricle. Small pericardial effusion. Extensive coronary artery calcification. Normal caliber thoracic aorta. Satisfactory opacification of pulmonary arteries to segmental level. No pulmonary embolus identified. Mediastinum/Nodes: Status post CABG. No lymphadenopathy. Normal thyroid gland. Normal thoracic esophagus and trachea. Lungs/Pleura: Small left-sided pleural effusion and platelike atelectasis in the left lung base. Upper Abdomen: Splenectomy. 15 mm left adrenal nodule measuring 9 HU compatible with adenoma. Musculoskeletal: Post median sternotomy with incomplete fusion. No significant diastases. No acute bony abnormality identified. Review of the MIP images confirms the above findings. IMPRESSION: 1. No pulmonary embolus identified. 2. Mild cardiomegaly and small increase pericardial effusion. 3. Extensive coronary artery calcification status post CABG. 4. Small left pleural effusion and left lung base platelike opacities, probably atelectasis, decreased from prior CT. 5. 15 mm left adrenal adenoma. Electronically Signed   By: Mitzi Hansen M.D.   On: 06/09/2016 17:28    Procedures Procedures (including critical care time)  Medications Ordered in ED Medications  morphine 4 MG/ML injection 4 mg (4 mg Intravenous Not Given 06/09/16 1850)  fentaNYL (SUBLIMAZE) injection 50 mcg (50 mcg Inhalation Given 06/09/16 1404)  morphine 4 MG/ML injection 4 mg (4 mg  Intravenous Given 06/09/16 1632)  iopamidol (ISOVUE-370) 76 % injection (100 mLs  Contrast Given 06/09/16 1701)  ondansetron (ZOFRAN) injection 4 mg (4 mg Intravenous Given 06/09/16 1838)  ketorolac (TORADOL) 30 MG/ML injection 30 mg (30 mg Intravenous Given 06/09/16 1855)  dexamethasone (DECADRON) tablet 10 mg (10 mg Oral Given 06/09/16 1934)     Initial Impression / Assessment and Plan / ED Course  I have reviewed the triage vital signs and the nursing notes.  Pertinent labs & imaging results that were available during my care of the patient were reviewed by me and considered in my medical decision making (see chart for details).      Final Clinical Impressions(s) / ED Diagnoses   Final diagnoses:  Chest pain  Acute pericarditis,  unspecified type    Labs: I-STAT troponin, CBG, CBC  Imaging: DG chest 2 view, CT angiogram  Consults:  Therapeutics: Morphine, Zofran, Toradol, normal saline  Discharge Meds:   Assessment/Plan: 41 year old male presents today with complaints of rib pain. Initial concern including PE in this patient with pleuritic component to this. CT scan shows no signs of infectious pulmonary etiology, no acute pulmonary embolus him. Patient was given morphine which did not significantly improve his symptoms. I have low suspicion for ACS in this patient with 2 negative troponins. Patient's presentation seems more consistent with pericarditis given his history, worse with leaning back, and ST changes in multiple leads. Pt has low grade temperature with slight elevation in WBC. Pt care was discussed with Dr. Rhunette CroftNanavati who evaluated the patient as well. Pt would like to manage this at home with naproxen.  He has an appointment with Dr. Excell Seltzerooper of cardiology in two weeks. Pt appears stable for discharge. He is given strict return precautions and follow up information. Pt verbalized understanding and agreement to today's plan had no further questions or concerns at the time of  discharge.     New Prescriptions New Prescriptions   NAPROXEN (NAPROSYN) 500 MG TABLET    Take 1 tablet (500 mg total) by mouth 2 (two) times daily.     Eyvonne MechanicHedges, Shamiyah Ngu, PA-C 06/09/16 2004    Derwood KaplanNanavati, Ankit, MD 06/10/16 [phone]

## 2016-06-09 NOTE — ED Notes (Signed)
Patient transported to CT 

## 2016-06-09 NOTE — Telephone Encounter (Signed)
Patient calling stating he has pain in his left lung when taking a deep breath.  Patient was seen yesterday and had the same symptoms.  Chest xray was performed.  Patient states he has fever of 99.6 this morning.  No pain radiating down left arm or in jaw.  No difficulty breathing. No cough.  Patient asking for antibiotic and possibly something for pain (specified tramadol).  Advised patient that Dr. Earlene PlaterWallace is with a patient right now but will speak with her when she comes out of the room and call patient back.  Patient verbalized understanding.

## 2016-06-09 NOTE — ED Notes (Signed)
Pt. Returned from CT.

## 2016-06-11 DIAGNOSIS — G4730 Sleep apnea, unspecified: Secondary | ICD-10-CM | POA: Insufficient documentation

## 2016-06-11 DIAGNOSIS — K219 Gastro-esophageal reflux disease without esophagitis: Secondary | ICD-10-CM | POA: Insufficient documentation

## 2016-06-18 ENCOUNTER — Other Ambulatory Visit: Payer: Self-pay | Admitting: Physician Assistant

## 2016-06-18 ENCOUNTER — Encounter: Payer: Self-pay | Admitting: Physician Assistant

## 2016-06-18 ENCOUNTER — Ambulatory Visit (INDEPENDENT_AMBULATORY_CARE_PROVIDER_SITE_OTHER): Payer: BLUE CROSS/BLUE SHIELD | Admitting: Physician Assistant

## 2016-06-18 VITALS — BP 122/74 | HR 64 | Ht 70.0 in | Wt 272.0 lb

## 2016-06-18 DIAGNOSIS — R509 Fever, unspecified: Secondary | ICD-10-CM

## 2016-06-18 DIAGNOSIS — I5022 Chronic systolic (congestive) heart failure: Secondary | ICD-10-CM

## 2016-06-18 DIAGNOSIS — I10 Essential (primary) hypertension: Secondary | ICD-10-CM

## 2016-06-18 DIAGNOSIS — E785 Hyperlipidemia, unspecified: Secondary | ICD-10-CM

## 2016-06-18 DIAGNOSIS — Z95810 Presence of automatic (implantable) cardiac defibrillator: Secondary | ICD-10-CM | POA: Diagnosis not present

## 2016-06-18 DIAGNOSIS — I2510 Atherosclerotic heart disease of native coronary artery without angina pectoris: Secondary | ICD-10-CM

## 2016-06-18 NOTE — Patient Instructions (Signed)
Medication Instructions:  1. Your physician recommends that you continue on your current medications as directed. Please refer to the Current Medication list given to you today.   Labwork: TODAY BLOOD CULTURES X 2   Testing/Procedures: Your physician has requested that you have an echocardiogram. Echocardiography is a painless test that uses sound waves to create images of your heart. It provides your doctor with information about the size and shape of your heart and how well your heart's chambers and valves are working. This procedure takes approximately one hour. There are no restrictions for this procedure.    Follow-Up: [facility], PAC 2-3 WEEKS SAME DAY DR. Excell SeltzerOOPER IS IN THE OFFICE PER PA   Any Other Special Instructions Will Be Listed Below (If Applicable).     If you need a refill on your cardiac medications before your next appointment, please call your pharmacy.
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
## 2016-06-23 LAB — CULTURE, BLOOD (SINGLE)

## 2016-06-24 LAB — CULTURE, BLOOD (SINGLE)

## 2016-06-29 ENCOUNTER — Telehealth: Payer: Self-pay | Admitting: *Deleted

## 2016-06-29 NOTE — Telephone Encounter (Signed)
-----   Message from Beatrice LecherScott T Weaver, [place]-C sent at 06/25/2016  4:48 PM EDT ----- Please call the patient Blood cultures negative. Continue with current treatment plan. Tereso NewcomerScott Weaver, PA-C   06/25/2016 4:47 PM

## 2016-06-29 NOTE — Telephone Encounter (Signed)
Pt has been notified of lab results that blood cultures are all negative. Pt thanked me for my call today and the good news.

## 2016-06-30 ENCOUNTER — Telehealth: Payer: Self-pay | Admitting: *Deleted

## 2016-06-30 ENCOUNTER — Ambulatory Visit (INDEPENDENT_AMBULATORY_CARE_PROVIDER_SITE_OTHER): Payer: BLUE CROSS/BLUE SHIELD | Admitting: Cardiology

## 2016-06-30 ENCOUNTER — Ambulatory Visit (HOSPITAL_COMMUNITY): Payer: BLUE CROSS/BLUE SHIELD | Attending: Internal Medicine

## 2016-06-30 ENCOUNTER — Encounter: Payer: Self-pay | Admitting: Cardiology

## 2016-06-30 ENCOUNTER — Other Ambulatory Visit: Payer: Self-pay

## 2016-06-30 VITALS — BP 120/84 | HR 62 | Ht 70.0 in | Wt 271.0 lb

## 2016-06-30 DIAGNOSIS — I340 Nonrheumatic mitral (valve) insufficiency: Secondary | ICD-10-CM | POA: Diagnosis not present

## 2016-06-30 DIAGNOSIS — Z4502 Encounter for adjustment and management of automatic implantable cardiac defibrillator: Secondary | ICD-10-CM

## 2016-06-30 DIAGNOSIS — Z951 Presence of aortocoronary bypass graft: Secondary | ICD-10-CM | POA: Insufficient documentation

## 2016-06-30 DIAGNOSIS — I255 Ischemic cardiomyopathy: Secondary | ICD-10-CM

## 2016-06-30 DIAGNOSIS — I4891 Unspecified atrial fibrillation: Secondary | ICD-10-CM | POA: Insufficient documentation

## 2016-06-30 DIAGNOSIS — I5022 Chronic systolic (congestive) heart failure: Secondary | ICD-10-CM | POA: Insufficient documentation

## 2016-06-30 DIAGNOSIS — I2510 Atherosclerotic heart disease of native coronary artery without angina pectoris: Secondary | ICD-10-CM | POA: Insufficient documentation

## 2016-06-30 DIAGNOSIS — Z95810 Presence of automatic (implantable) cardiac defibrillator: Secondary | ICD-10-CM | POA: Diagnosis not present

## 2016-06-30 DIAGNOSIS — Z8249 Family history of ischemic heart disease and other diseases of the circulatory system: Secondary | ICD-10-CM | POA: Diagnosis not present

## 2016-06-30 DIAGNOSIS — E785 Hyperlipidemia, unspecified: Secondary | ICD-10-CM | POA: Diagnosis not present

## 2016-06-30 DIAGNOSIS — R509 Fever, unspecified: Secondary | ICD-10-CM

## 2016-06-30 DIAGNOSIS — I252 Old myocardial infarction: Secondary | ICD-10-CM | POA: Insufficient documentation

## 2016-06-30 DIAGNOSIS — Z87891 Personal history of nicotine dependence: Secondary | ICD-10-CM | POA: Diagnosis not present

## 2016-06-30 DIAGNOSIS — I10 Essential (primary) hypertension: Secondary | ICD-10-CM | POA: Diagnosis not present

## 2016-06-30 DIAGNOSIS — I110 Hypertensive heart disease with heart failure: Secondary | ICD-10-CM | POA: Insufficient documentation

## 2016-06-30 LAB — CUP PACEART INCLINIC DEVICE CHECK
Battery Voltage: 3.11 V
HIGH POWER IMPEDANCE MEASURED VALUE: 82 Ohm
Implantable Lead Implant Date: 20171102
Implantable Lead Location: 753860
Implantable Lead Serial Number: 49533838
Implantable Pulse Generator Implant Date: 20171102
Lead Channel Impedance Value: 491 Ohm
Lead Channel Pacing Threshold Amplitude: 0.8 V
Lead Channel Pacing Threshold Pulse Width: 0.4 ms
Lead Channel Setting Sensing Sensitivity: 0.8 mV
MDC IDC MSMT LEADCHNL RA SENSING INTR AMPL: 7.8 mV
MDC IDC MSMT LEADCHNL RV SENSING INTR AMPL: 24.2 mV
MDC IDC SESS DTM: 20180620112139
MDC IDC SET LEADCHNL RV PACING AMPLITUDE: 3.5 V
MDC IDC SET LEADCHNL RV PACING PULSEWIDTH: 0.4 ms
MDC IDC STAT BRADY RV PERCENT PACED: 0 %
Pulse Gen Serial Number: 60940552
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
## 2016-06-30 NOTE — Patient Instructions (Signed)
Medication Instructions:    Your physician recommends that you continue on your current medications as directed. Please refer to the Current Medication list given to you today.  --- If you need a refill on your cardiac medications before your next appointment, please call your pharmacy. ---  Labwork:  None ordered  Testing/Procedures:  None ordered  Follow-Up: Remote monitoring is used to monitor your Pacemaker of ICD from home. This monitoring reduces the number of office visits required to check your device to one time per year. It allows us to keep an eye on the functioning of your device to ensure it is working properly. You are scheduled for a device check from home on 09/29/2016. You may send your transmission at any time that day. If you have a wireless device, the transmission will be sent automatically. After your physician reviews your transmission, you will receive a postcard with your next transmission date.   Your physician wants you to follow-up in: 1 year with Dr. Elberta Fortisamnitz.  You will receive a reminder letter in the mail two months in advance. If you don't receive a letter, please call our office to schedule the follow-up appointment.  Thank you for choosing [facility]!!   Dory HornSherri Keilen Kahl, RN [phone]

## 2016-06-30 NOTE — Telephone Encounter (Signed)
-----   Message from Beatrice LecherScott T Weaver, [place]-C sent at 06/30/2016  4:54 PM EDT ----- Please call the patient. The echocardiogram shows the heart function is stable (ejection fraction 35-40%). Continue current treatment plan.  Please fax a copy of this study result to his PCP:  Helane RimaWallace, Erica, DO  Thanks! Tereso NewcomerScott Weaver, PA-C    06/30/2016 4:49 PM

## 2016-06-30 NOTE — Telephone Encounter (Signed)
Pt has been notified of echo results and findings by phone with verbal understanding. Pt thanked me for my call as well as verified his appt Friday 6/22 with Bing NeighborsScott W. PA.

## 2016-07-02 ENCOUNTER — Encounter: Payer: Self-pay | Admitting: Physician Assistant

## 2016-07-02 ENCOUNTER — Ambulatory Visit (INDEPENDENT_AMBULATORY_CARE_PROVIDER_SITE_OTHER): Payer: BLUE CROSS/BLUE SHIELD | Admitting: Physician Assistant

## 2016-07-02 VITALS — BP 130/60 | HR 66 | Ht 70.0 in | Wt 268.8 lb

## 2016-07-02 DIAGNOSIS — Z95810 Presence of automatic (implantable) cardiac defibrillator: Secondary | ICD-10-CM

## 2016-07-02 DIAGNOSIS — I2510 Atherosclerotic heart disease of native coronary artery without angina pectoris: Secondary | ICD-10-CM

## 2016-07-02 DIAGNOSIS — I10 Essential (primary) hypertension: Secondary | ICD-10-CM | POA: Diagnosis not present

## 2016-07-02 DIAGNOSIS — I5022 Chronic systolic (congestive) heart failure: Secondary | ICD-10-CM | POA: Diagnosis not present

## 2016-07-02 DIAGNOSIS — E785 Hyperlipidemia, unspecified: Secondary | ICD-10-CM

## 2016-07-02 DIAGNOSIS — Z8701 Personal history of pneumonia (recurrent): Secondary | ICD-10-CM

## 2016-07-02 MED ORDER — METOPROLOL TARTRATE 50 MG PO TABS: 50.0000 mg | ORAL_TABLET | Freq: Two times a day (BID) | ORAL | 3 refills | Status: DC

## 2016-07-02 MED ORDER — PRAVASTATIN SODIUM 80 MG PO TABS: 80.0000 mg | ORAL_TABLET | Freq: Every day | ORAL | 3 refills | Status: DC

## 2016-07-02 MED ORDER — LISINOPRIL 20 MG PO TABS: 20.0000 mg | ORAL_TABLET | Freq: Every day | ORAL | 3 refills | Status: DC

## 2016-07-02 MED ORDER — FUROSEMIDE 20 MG PO TABS: 20.0000 mg | ORAL_TABLET | ORAL | 0 refills | Status: DC

## 2016-07-02 NOTE — Patient Instructions (Signed)
Medication Instructions:  1. CHANGE LASIX 20 MG TO TAKE EVERY Monday, WED, AND FRI'S   2. REFILLS HAVE BEEN SENT IN FOR METOPROLOL, LISINOPRIL, LASIX, PRAVASTATIN  Labwork: 1. BMET TO BE DONE IN 2 WEEKS  Testing/Procedures: NONE ORDRED  Follow-Up: DR. Excell SeltzerOOPER 3-4 MONTHS   Any Other Special Instructions Will Be Listed Below (If Applicable).     If you need a refill on your cardiac medications before your next appointment, please call your pharmacy.
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
## 2016-07-16 ENCOUNTER — Telehealth: Payer: Self-pay | Admitting: *Deleted

## 2016-07-16 ENCOUNTER — Other Ambulatory Visit: Payer: BLUE CROSS/BLUE SHIELD | Admitting: *Deleted

## 2016-07-16 DIAGNOSIS — I5022 Chronic systolic (congestive) heart failure: Secondary | ICD-10-CM

## 2016-07-16 LAB — BASIC METABOLIC PANEL
BUN/Creatinine Ratio: 16 (ref 9–20)
BUN: 14 mg/dL (ref 6–24)
CALCIUM: 9.8 mg/dL (ref 8.7–10.2)
CHLORIDE: 97 mmol/L (ref 96–106)
CO2: 21 mmol/L (ref 20–29)
Creatinine, Ser: 0.9 mg/dL (ref 0.76–1.27)
GFR calc non Af Amer: 105 mL/min/{1.73_m2} (ref >59)
GFR, EST AFRICAN AMERICAN: 121 mL/min/{1.73_m2} (ref >59)
GLUCOSE: 123 mg/dL — AB (ref 65–99)
POTASSIUM: 4.4 mmol/L (ref 3.5–5.2)
Sodium: 137 mmol/L (ref 134–144)

## 2016-07-16 NOTE — Telephone Encounter (Signed)
Lmtcb to go over lab results . Results have also been sent to [facility]. 

## 2016-07-16 NOTE — Telephone Encounter (Signed)
-----   Message from Beatrice LecherScott T Weaver, [place] sent at 07/16/2016  5:06 PM EDT ----- Please call patient: The kidney function (BUN, Creatinine) and potassium are normal. Glucose elevated - follow up with PCP.  Continue with current treatment plan. Tereso NewcomerScott Weaver, PA-C    07/16/2016 5:05 PM

## 2016-07-19 ENCOUNTER — Telehealth: Payer: Self-pay | Admitting: *Deleted

## 2016-07-19 NOTE — Telephone Encounter (Signed)
New message ° ° °Pt calling back °

## 2016-07-19 NOTE — Telephone Encounter (Signed)
-----   Message from Beatrice LecherScott T Weaver, [place] sent at 07/16/2016  5:06 PM EDT ----- Please call patient: The kidney function (BUN, Creatinine) and potassium are normal. Glucose elevated - follow up with PCP.  Continue with current treatment plan. Tereso NewcomerScott Weaver, PA-C    07/16/2016 5:05 PM

## 2016-07-19 NOTE — Telephone Encounter (Signed)
Pt has been notified of lab results by phone with verbal understanding. Pt thanked me for my call today.   

## 2016-08-03 ENCOUNTER — Encounter: Payer: Self-pay | Admitting: Internal Medicine

## 2016-08-03 ENCOUNTER — Ambulatory Visit (INDEPENDENT_AMBULATORY_CARE_PROVIDER_SITE_OTHER): Payer: BLUE CROSS/BLUE SHIELD | Admitting: Internal Medicine

## 2016-08-03 VITALS — BP 130/94 | HR 68 | Ht 70.0 in | Wt 262.0 lb

## 2016-08-03 DIAGNOSIS — G4730 Sleep apnea, unspecified: Secondary | ICD-10-CM

## 2016-08-03 DIAGNOSIS — R0609 Other forms of dyspnea: Secondary | ICD-10-CM | POA: Insufficient documentation

## 2016-08-03 DIAGNOSIS — I10 Essential (primary) hypertension: Secondary | ICD-10-CM | POA: Diagnosis not present

## 2016-08-03 DIAGNOSIS — J449 Chronic obstructive pulmonary disease, unspecified: Secondary | ICD-10-CM

## 2016-08-03 MED ORDER — IRBESARTAN 300 MG PO TABS: 300.0000 mg | ORAL_TABLET | Freq: Every day | ORAL | 11 refills | Status: DC

## 2016-08-03 NOTE — Progress Notes (Signed)
Subjective:     Patient ID: William Rubio, male   DOB: 1975/05/28,    MRN: [id number]  HPI  22 yowm quit cig smoking 2013 with h/o heart dz in his 80's s/p Oct  2017 cabg and improved energy then gradually worse with daytime sleepiness and daytime improving ex tol to point to where walked/jogged 4 miles one day but still complaining of intermittent sob not proportional to ex and variable sense of chest congestion so referred to pulmonary clinic 08/03/2016 by Dr   Helane Rima   With GOLD II criteria for copd at initial eval   08/03/2016 1st  Pulmonary office visit/ William Rubio   Chief Complaint  Patient presents with  . Pulmonary Consult    Referred by Dr. Helane Rima.  Pt c/o fatigue and minimal SOB for the past 2 months. He states that he was dxed with bronchial infection a few months back.   having intermittend severe chest congestion but minimal white mucus productions and intermittent sob not proportional to ex with dx of recurrent "bronchitis"  But not on inhalers and typically day >> noct symptoms and overt HB "taking 3 zantacs a day" Also bothered by fatigue and daytime drowsiness and has sleep study ordered by pcp   No obvious day to day or daytime variability or assoc excess/ purulent sputum or mucus plugs or hemoptysis or cp or chest tightness, subjective wheeze or overt sinus or hb symptoms. No unusual exp hx or h/o childhood pna/ asthma or knowledge of premature birth.  Sleeping ok without nocturnal  or early am exacerbation  of respiratory  c/o's or need for noct saba. Also denies any obvious fluctuation of symptoms with weather or environmental changes or other aggravating or alleviating factors except as outlined above   Current Medications, Allergies, Complete Past Medical History, Past Surgical History, Family History, and Social History were reviewed in [employer] record.  ROS  The following are not active complaints unless bolded sore throat,  dysphagia, dental problems, itching, sneezing,  nasal congestion or excess/ purulent secretions, ear ache,   fever, chills, sweats, unintended wt loss, classically pleuritic or exertional cp,  orthopnea pnd or leg swelling, presyncope, palpitations, abdominal pain, anorexia, nausea, vomiting, diarrhea  or change in bowel or bladder habits, change in stools or urine, dysuria,hematuria,  rash, arthralgias, visual complaints, headache, numbness, weakness or ataxia or problems with walking or coordination,  change in mood/affect or memory.           Review of Systems     Objective:   Physical Exam   Obese amb wm unusual affect "my primary sent me here to check out my lungs"  With freq vigorous throat clearing   Wt Readings from Last 3 Encounters:  08/03/16 262 lb (118.8 kg)  07/02/16 268 lb 12.8 oz (121.9 kg)  06/30/16 271 lb (122.9 kg)    Vital signs reviewed   - Note on arrival 02 sats  96% on RA and BP 130/94      HEENT: nl dentition, turbinates bilaterally, and oropharynx. Nl external ear canals without cough reflex   NECK :  without JVD/Nodes/TM/ nl carotid upstrokes bilaterally   LUNGS: no acc muscle use,  Nl contour chest which is clear to A and P bilaterally without cough on insp or exp maneuvers   CV:  RRR  no s3 or murmur or increase in P2, and no edema   ABD:  soft and nontender with nl inspiratory excursion in the supine position.  No bruits or organomegaly appreciated, bowel sounds nl  MS:  Nl gait/ ext warm without deformities, calf tenderness, cyanosis or clubbing No obvious joint restrictions   SKIN: warm and dry without lesions    NEURO:  alert, approp, nl sensorium with  no motor or cerebellar deficits apparent.     I personally reviewed images and agree with radiology impression as follows:   Chest CTa  06/09/16 1. No pulmonary embolus identified. 2. Mild cardiomegaly and small increase pericardial effusion. 3. Extensive coronary artery calcification status  post CABG. 4. Small left pleural effusion and left lung base platelike opacities, probably atelectasis, decreased from prior CT. 5. 15 mm left adrenal adenoma.        Assessment:

## 2016-08-03 NOTE — Patient Instructions (Addendum)
Stop your lisinopril and start Avarpro 300 mg daily one half daily and if not satisfied increase to a whole   Change zantac to 150 mg after breakfast and supper   GERD (REFLUX)  is an extremely common cause of respiratory symptoms just like yours , many times with no obvious heartburn at all.    It can be treated with medication, but also with lifestyle changes including elevation of the head of your bed (ideally with 6 inch  bed blocks),  Smoking cessation, avoidance of late meals, excessive alcohol, and avoid fatty foods, chocolate, peppermint, colas, red wine, and acidic juices such as orange juice.  NO MINT OR MENTHOL PRODUCTS SO NO COUGH DROPS   USE SUGARLESS CANDY INSTEAD (Jolley ranchers or Stover's or Life Savers) or even ice chips will also do - the key is to swallow to prevent all throat clearing. NO OIL BASED VITAMINS - use powdered substitutes.   you need to have the sleep study but try to have it at least two weeks after the change in your lisinopril   Please schedule a follow up office visit in 6 weeks, call sooner if needed

## 2016-08-04 ENCOUNTER — Encounter: Payer: Self-pay | Admitting: Internal Medicine

## 2016-08-04 DIAGNOSIS — J449 Chronic obstructive pulmonary disease, unspecified: Secondary | ICD-10-CM | POA: Insufficient documentation

## 2016-08-04 NOTE — Assessment & Plan Note (Signed)
Echo  06/30/16 LVEF 35-40%, normal wall thickness, akinesis and thinning of the   anterior, anteroseptal apical and distal inferoapical walls   consistent with LAD territory infarct/scar, AICD wires present,   dilated aortic root at the sinotubular junction of 4.2 cm, mild   MR, mildly dilated RV with mildly reduced systolic function, mild   RAE, dilated IVC.  Clearly multifactorial doe but strongly doubt he is really flow limited from a copd perspective (see separate a/p)

## 2016-08-04 NOTE — Assessment & Plan Note (Signed)
Quit smoking 2013  Spirometry 08/03/2016  FEV1 3.17 (69%)  Ratio 69  With classic concave curvature x rx prior - Rec trial off acei x 6 weeks 08/03/2016 >>>   When respiratory symptoms begin or become refractory well after a patient reports complete smoking cessation,  Especially when this wasn't the case while they were smoking, a red flag is raised based on the work of Dr Primitivo GauzeFletcher which states:  if you quit smoking when your best day FEV1 is still well preserved it is highly unlikely you will progress to severe disease.  That is to say, once the smoking stops,  the symptoms should not suddenly erupt or markedly worsen.  If so, the differential diagnosis should include  obesity/deconditioning,  LPR/Reflux/Aspiration syndromes,  occult CHF, or  especially side effect of medications commonly used in this population esp acei  rec rx for gerd/ off acei x 6 weeks then ov to regroup and consider rx if symptoms warrant  Discussed  I reviewed the Fletcher curve with the patient that basically indicates  if you quit smoking when your best day FEV1 is still well preserved (as is clearly  the case here)  it is highly unlikely you will progress to severe disease and informed the patient there was  no medication on the market that has proven to alter the curve/ its downward trajectory  or the likelihood of progression of their disease(unlike other chronic medical conditions such as atheroclerosis where we do think we can change the natural hx with risk reducing meds)    Therefore stopping smoking and maintaining abstinence is the most important aspect of care, not choice of inhalers or for that matter, doctors.      Total time devoted to counseling  > 50 % of initial 60 min office visit:  review case with pt/ discussion of options/alternatives/ personally creating written customized instructions  in presence of pt  then going over those specific  Instructions directly with the pt including how to use all of the  meds but in particular covering each new medication in detail and the difference between the maintenance= "automatic" meds and the prns using an action plan format for the latter (If this problem/symptom => do that organization reading Left to right).  Please see AVS from this visit for a full list of these instructions which I personally wrote for this pt and  are unique to this visit.

## 2016-08-04 NOTE — Assessment & Plan Note (Signed)
In the best review of chronic cough to date ( NEJM 2016 375 [phone]) ,  ACEi are now felt to cause cough in up to  20% of pts which is a 4 fold increase from previous reports and does not include the variety of non-specific complaints we see in pulmonary clinic in pts on ACEi but previously attributed to another dx like  Copd/asthma and  include PNDS, throat and chest congestion, "bronchitis", unexplained dyspnea and noct "strangling" sensations, and hoarseness, but also  atypical /refractory GERD symptoms like dysphagia and "bad heartburn"   The only way I know  to prove this is not an "ACEi Case" is a trial off ACEi x a minimum of 6 weeks then regroup.   rec try avapro 300 mg one half daily and self monitor but based on poor control on day of ov on lisinopril 20 mg daily may need the whole 300   Discussed in detail all the  indications, usual  risks and alternatives  relative to the benefits with patient who agrees to proceed with conservative f/u as outlined

## 2016-08-04 NOTE — Assessment & Plan Note (Signed)
Body mass index is 37.59 kg/m.  -  trending down slightly/ encouraged No results found for: TSH   Contributing to gerd risk/ doe/reviewed the need and the process to achieve and maintain neg calorie balance > defer f/u primary care including intermittently monitoring thyroid status

## 2016-08-04 NOTE — Assessment & Plan Note (Signed)
Being addressed by pcp > refer to sleep medicine prn

## 2016-08-09 ENCOUNTER — Telehealth: Payer: Self-pay | Admitting: *Deleted

## 2016-08-09 NOTE — Telephone Encounter (Signed)
-----   Message from Beatrice LecherScott T Weaver, [place] sent at 08/06/2016  8:24 AM EDT ----- Please make sure he has a follow up in September 2018 [facility]

## 2016-08-09 NOTE — Telephone Encounter (Signed)
I called pt this morning per Tereso NewcomerScott Weaver, PA to make sure he has appt in Sept. 2018. I stated to the pt Dr. Excell Seltzerooper schedule is full however, I will schedule with Tereso NewcomerScott Weaver, PA same day Dr. Excell Seltzerooper is in the office. Pt said he is not going to schedule an appt right now and he will wait until it closer to Sept. State he does not know his schedule yet. I did advise pt that the schedules fill up very quickly and to please call the office as soon as he knows his schedule. Pt said he will.

## 2016-09-29 ENCOUNTER — Ambulatory Visit (INDEPENDENT_AMBULATORY_CARE_PROVIDER_SITE_OTHER): Payer: BLUE CROSS/BLUE SHIELD | Admitting: *Deleted

## 2016-09-29 DIAGNOSIS — I255 Ischemic cardiomyopathy: Secondary | ICD-10-CM

## 2016-09-29 NOTE — Progress Notes (Signed)
Remote ICD transmission.   

## 2016-10-01 ENCOUNTER — Encounter: Payer: Self-pay | Admitting: Cardiology

## 2016-10-20 ENCOUNTER — Telehealth: Payer: Self-pay

## 2016-10-20 NOTE — Telephone Encounter (Signed)
Called pt regarding self terminating episode of VF from 10:50pm on 10/9, pt recalled running down some stairs to his car and felt a little lightheaded, pt denied syncope informed pt that I would review the episode with Dr. Elberta Fortis and would call back if there were any further recommendations. Pt voiced understanding.

## 2016-10-26 LAB — CUP PACEART REMOTE DEVICE CHECK
Implantable Lead Implant Date: 20171102
Implantable Lead Location: 753860
Implantable Lead Model: 414005
Implantable Lead Serial Number: 49533838
Lead Channel Setting Pacing Pulse Width: 0.4 ms
Lead Channel Setting Sensing Sensitivity: 0.8 mV
MDC IDC PG IMPLANT DT: 20171102
MDC IDC PG SERIAL: 60940552
MDC IDC SESS DTM: 20181016152850
MDC IDC SET LEADCHNL RV PACING AMPLITUDE: 3.5 V

## 2016-11-02 ENCOUNTER — Ambulatory Visit (INDEPENDENT_AMBULATORY_CARE_PROVIDER_SITE_OTHER): Payer: BLUE CROSS/BLUE SHIELD

## 2016-11-02 ENCOUNTER — Encounter: Payer: Self-pay | Admitting: Physician Assistant

## 2016-11-02 ENCOUNTER — Telehealth: Payer: Self-pay | Admitting: Family Medicine

## 2016-11-02 ENCOUNTER — Ambulatory Visit (INDEPENDENT_AMBULATORY_CARE_PROVIDER_SITE_OTHER): Payer: BLUE CROSS/BLUE SHIELD | Admitting: Physician Assistant

## 2016-11-02 VITALS — BP 112/80 | HR 79 | Temp 98.2°F | Ht 70.0 in | Wt 264.6 lb

## 2016-11-02 DIAGNOSIS — R05 Cough: Secondary | ICD-10-CM

## 2016-11-02 DIAGNOSIS — R059 Cough, unspecified: Secondary | ICD-10-CM

## 2016-11-02 DIAGNOSIS — I10 Essential (primary) hypertension: Secondary | ICD-10-CM | POA: Diagnosis not present

## 2016-11-02 DIAGNOSIS — R42 Dizziness and giddiness: Secondary | ICD-10-CM | POA: Diagnosis not present

## 2016-11-02 NOTE — Telephone Encounter (Signed)
Patient Name: William Rubio  DOB: 08-30-1975    Initial Comment Caller states cardio pt and BP has been low; 100/60, normally 130/8?. feeling lightheaded past couple of weeks;    Nurse Assessment  Nurse: Stefano GaulStringer, RN, Dwana CurdVera Date/Time (Eastern Time): 11/02/2016 1:08:38 PM  Confirm and document reason for call. If symptomatic, describe symptoms. ---Caller states his BP is low around 100/60. Normally 130/8? His BP medications have been changed about 2 weeks ago. Has had nasal congestion and cough for the past 2 weeks. Has been lightheaded the past 2 weeks. No chest pain or SOB.  Does the patient have any new or worsening symptoms? ---Yes  Will a triage be completed? ---Yes  Related visit to physician within the last 2 weeks? ---No  Does the PT have any chronic conditions? (i.e. diabetes, asthma, etc.) ---Yes  List chronic conditions. ---defibrillator; heart problems; HTN  Is this a behavioral health or substance abuse call? ---No     Guidelines    Guideline Title Affirmed Question Affirmed Notes  Low Blood Pressure [1] Systolic BP 90-110 AND [2] taking blood pressure medications AND [3] dizzy, lightheaded or weak    Final Disposition User   See Physician within 4 Hours (or PCP triage) Stefano GaulStringer, RN, Vera    Comments  appt scheduled for 11/02/2016 at 2:30 pm with Jarold MottoSamantha Worley   Referrals  REFERRED TO PCP OFFICE   Caller Disagree/Comply Comply  Caller Understands Yes  PreDisposition Call Doctor

## 2016-11-02 NOTE — Patient Instructions (Addendum)
It was great to meet you!  Please make an appointment with Dr. Helane RimaErica Wallace for this week (tell the front desk this should be a 30-min appointment)  Please make an appointment with your cardiologist ASAP for further evaluation, it appears you were due for a 5740-month follow-up in Sep  Any worsening signs/symptoms, please go to the emergency room

## 2016-11-02 NOTE — Telephone Encounter (Signed)
Pt scheduled to see Rinaldo CloudSam Worley today at 2:30.

## 2016-11-02 NOTE — Telephone Encounter (Signed)
Pt fu from msg left 10-20-16 regarding episode, he hasn't heard back and wanted to know what Dr. Elberta Fortisamnitz thought about it, he has been lightheaded since then, denies any other symptoms-pls call [phone]
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
## 2016-11-03 ENCOUNTER — Other Ambulatory Visit: Payer: Self-pay | Admitting: Surgical

## 2016-11-03 DIAGNOSIS — G4730 Sleep apnea, unspecified: Secondary | ICD-10-CM

## 2016-11-03 LAB — COMPREHENSIVE METABOLIC PANEL
ALBUMIN: 4.6 g/dL (ref 3.5–5.2)
ALT: 22 U/L (ref 0–53)
AST: 18 U/L (ref 0–37)
Alkaline Phosphatase: 60 U/L (ref 39–117)
BUN: 20 mg/dL (ref 6–23)
CHLORIDE: 103 meq/L (ref 96–112)
CO2: 25 mEq/L (ref 19–32)
CREATININE: 0.85 mg/dL (ref 0.40–1.50)
Calcium: 9.9 mg/dL (ref 8.4–10.5)
GFR: 104.48 mL/min (ref >60.00)
GLUCOSE: 81 mg/dL (ref 70–99)
Potassium: 4.6 mEq/L (ref 3.5–5.1)
SODIUM: 138 meq/L (ref 135–145)
Total Bilirubin: 0.5 mg/dL (ref 0.2–1.2)
Total Protein: 7.1 g/dL (ref 6.0–8.3)

## 2016-11-03 LAB — CBC WITH DIFFERENTIAL/PLATELET
Basophils Absolute: 0.2 10*3/uL — ABNORMAL HIGH (ref 0.0–0.1)
Basophils Relative: 1.5 % (ref 0.0–3.0)
EOS PCT: 1 % (ref 0.0–5.0)
Eosinophils Absolute: 0.1 10*3/uL (ref 0.0–0.7)
HCT: 44.1 % (ref 39.0–52.0)
HEMOGLOBIN: 15.2 g/dL (ref 13.0–17.0)
LYMPHS ABS: 4.4 10*3/uL — AB (ref 0.7–4.0)
Lymphocytes Relative: 30.3 % (ref 12.0–46.0)
MCHC: 34.5 g/dL (ref 30.0–36.0)
MCV: 92.7 fl (ref 78.0–100.0)
MONO ABS: 1.6 10*3/uL — AB (ref 0.1–1.0)
Monocytes Relative: 10.9 % (ref 3.0–12.0)
NEUTROS PCT: 56.3 % (ref 43.0–77.0)
Neutro Abs: 8.2 10*3/uL — ABNORMAL HIGH (ref 1.4–7.7)
PLATELETS: 397 10*3/uL (ref 150.0–400.0)
RBC: 4.76 Mil/uL (ref 4.22–5.81)
RDW: 13.2 % (ref 11.5–15.5)
WBC: 14.5 10*3/uL — ABNORMAL HIGH (ref 4.0–10.5)

## 2016-11-03 NOTE — Progress Notes (Signed)
I have placed all of the orders  

## 2016-11-03 NOTE — Telephone Encounter (Signed)
LMTCB/sss  See result note under CV procedure for recommendations.

## 2016-11-04 MED ORDER — METOPROLOL TARTRATE 100 MG PO TABS: 100.0000 mg | ORAL_TABLET | Freq: Two times a day (BID) | ORAL | 7 refills | Status: DC

## 2016-11-04 NOTE — Telephone Encounter (Signed)
Pt calling back regarding episode. Reviewed by Dr. Elberta Fortisamnitz- he recommends increasing metoprolol to 100mg  BID. I will send that Rx into [facility] on [address].   He also wants to set up an appt with General Cardiology. It looks like Danielle Rankinarol Fiato, CMA was in communication with him about an appt in September but he didn't know his schedule at that time. I will route to her to arrange.

## 2016-11-05 NOTE — Telephone Encounter (Signed)
S/w pt today to schedule appt . Pt was seen 06/2016 and was advised for a 3 month f/u. See phone note from 07/2016. Pt has now been scheduled today to see Tereso NewcomerScott Weaver, PA 11/16/16 @ 11:45. Pt is agreeable to plan of care.

## 2016-11-16 ENCOUNTER — Encounter: Payer: Self-pay | Admitting: Physician Assistant

## 2016-11-16 ENCOUNTER — Ambulatory Visit (INDEPENDENT_AMBULATORY_CARE_PROVIDER_SITE_OTHER): Payer: BLUE CROSS/BLUE SHIELD | Admitting: Physician Assistant

## 2016-11-16 VITALS — BP 91/60 | HR 64 | Ht 70.0 in | Wt 268.0 lb

## 2016-11-16 DIAGNOSIS — I5022 Chronic systolic (congestive) heart failure: Secondary | ICD-10-CM

## 2016-11-16 DIAGNOSIS — I471 Supraventricular tachycardia: Secondary | ICD-10-CM

## 2016-11-16 DIAGNOSIS — Z95810 Presence of automatic (implantable) cardiac defibrillator: Secondary | ICD-10-CM | POA: Diagnosis not present

## 2016-11-16 DIAGNOSIS — I472 Ventricular tachycardia: Secondary | ICD-10-CM

## 2016-11-16 DIAGNOSIS — I4729 Other ventricular tachycardia: Secondary | ICD-10-CM

## 2016-11-16 DIAGNOSIS — I2510 Atherosclerotic heart disease of native coronary artery without angina pectoris: Secondary | ICD-10-CM | POA: Diagnosis not present

## 2016-11-16 MED ORDER — LISINOPRIL 10 MG PO TABS: 10.0000 mg | ORAL_TABLET | Freq: Every day | ORAL | 3 refills | Status: DC

## 2016-11-16 MED ORDER — METOPROLOL TARTRATE 50 MG PO TABS: 75.0000 mg | ORAL_TABLET | Freq: Two times a day (BID) | ORAL | 3 refills | Status: DC
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
## 2016-11-16 NOTE — Patient Instructions (Signed)
Medication Instructions:  1. DISCONTINUE IRBESARTAN  2. START LISINOPRIL 10 MG ONCE DAILY. RX HAS BEEN SENT IN TO YOUR PHARMACY  3. INCREASE METOPROLOL 75 MG TWICE DAILY. RX HAS BEEN SENT IN TO YOUR PHARMACY.  Labwork: TODAY: BMET, MAGNESIUM  2 WEEKS (11/30/16): BMET  Testing/Procedures: NONE ORDERED TODAY  Follow-Up: 1 MONTH WITH DR. Elberta FortisAMNITZ  Your physician wants you to follow-up in 6 MONTHS WITH SCOTT WEAVER PAC. You will receive a reminder letter in the mail two months in advance. If you don't receive a letter, please call our office to schedule the follow-up appointment.   Any Other Special Instructions Will Be Listed Below (If Applicable).     If you need a refill on your cardiac medications before your next appointment, please call your pharmacy.

## 2016-11-17 ENCOUNTER — Telehealth: Payer: Self-pay

## 2016-11-17 LAB — BASIC METABOLIC PANEL
BUN / CREAT RATIO: 20 (ref 9–20)
BUN: 14 mg/dL (ref 6–24)
CO2: 26 mmol/L (ref 20–29)
CREATININE: 0.69 mg/dL — AB (ref 0.76–1.27)
Calcium: 9.8 mg/dL (ref 8.7–10.2)
Chloride: 101 mmol/L (ref 96–106)
GFR calc non Af Amer: 116 mL/min/{1.73_m2} (ref >59)
GFR, EST AFRICAN AMERICAN: 134 mL/min/{1.73_m2} (ref >59)
Glucose: 95 mg/dL (ref 65–99)
Potassium: 4.9 mmol/L (ref 3.5–5.2)
Sodium: 142 mmol/L (ref 134–144)

## 2016-11-17 LAB — MAGNESIUM: Magnesium: 2.2 mg/dL (ref 1.6–2.3)

## 2016-11-17 NOTE — Telephone Encounter (Signed)
Left message on patients voicmail (ok per DPR on file) letting him know that per [facility]res[facility] NewcomerScott Weaver [facility] Ren[facility] function, potassium, magnesium normal.Continue current medications and follow up as planned. Let him know to call office with any questions or concerns.

## 2016-11-18 NOTE — Addendum Note (Signed)
Addended by: Kerrie BuffaloANIELS, MICHELE on: 11/18/2016 12:07 PM   Modules accepted: Orders

## 2016-11-30 ENCOUNTER — Other Ambulatory Visit: Payer: BLUE CROSS/BLUE SHIELD

## 2016-12-07 ENCOUNTER — Telehealth: Payer: Self-pay | Admitting: *Deleted

## 2016-12-07 NOTE — Telephone Encounter (Signed)
Pt returning call. Explained new AF and confirmed he would be there 12/21/16. He requests an earlier appt if available. Msg sent to scheduling.

## 2016-12-07 NOTE — Telephone Encounter (Signed)
LMOM (per DPR) regarding ICD remote monitoring alert (new AF). Confirming that he will be at the Dec 11 appt with Dr. Elberta Fortisamnitz to discuss. Gave Device Clinic number for return call if needed.

## 2016-12-21 ENCOUNTER — Encounter: Payer: BLUE CROSS/BLUE SHIELD | Admitting: Cardiology

## 2016-12-29 ENCOUNTER — Ambulatory Visit (INDEPENDENT_AMBULATORY_CARE_PROVIDER_SITE_OTHER): Payer: BLUE CROSS/BLUE SHIELD | Admitting: *Deleted

## 2016-12-29 ENCOUNTER — Telehealth: Payer: Self-pay | Admitting: Cardiology

## 2016-12-29 DIAGNOSIS — I255 Ischemic cardiomyopathy: Secondary | ICD-10-CM

## 2016-12-29 NOTE — Progress Notes (Signed)
Remote ICD transmission.   

## 2016-12-29 NOTE — Telephone Encounter (Signed)
Will discuss address/reivew issues at tomorrow's appt. Pt is agreeable to plan.
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
## 2016-12-30 ENCOUNTER — Encounter: Payer: Self-pay | Admitting: Cardiology

## 2016-12-30 ENCOUNTER — Ambulatory Visit: Payer: BLUE CROSS/BLUE SHIELD | Admitting: Cardiology

## 2016-12-30 VITALS — BP 120/90 | HR 63 | Ht 70.0 in | Wt 276.0 lb

## 2016-12-30 DIAGNOSIS — Z95810 Presence of automatic (implantable) cardiac defibrillator: Secondary | ICD-10-CM | POA: Diagnosis not present

## 2016-12-30 DIAGNOSIS — I480 Paroxysmal atrial fibrillation: Secondary | ICD-10-CM

## 2016-12-30 DIAGNOSIS — I25810 Atherosclerosis of coronary artery bypass graft(s) without angina pectoris: Secondary | ICD-10-CM

## 2016-12-30 DIAGNOSIS — I255 Ischemic cardiomyopathy: Secondary | ICD-10-CM | POA: Diagnosis not present

## 2016-12-30 DIAGNOSIS — I5022 Chronic systolic (congestive) heart failure: Secondary | ICD-10-CM | POA: Diagnosis not present

## 2016-12-30 HISTORY — DX: Paroxysmal atrial fibrillation: I48.0

## 2016-12-30 LAB — CUP PACEART INCLINIC DEVICE CHECK
Battery Voltage: 3.11 V
HighPow Impedance: 83 Ohm
Implantable Lead Implant Date: 20171102
Implantable Lead Model: 414005
Implantable Pulse Generator Implant Date: 20171102
Lead Channel Pacing Threshold Amplitude: 0.7 V
Lead Channel Pacing Threshold Pulse Width: 0.4 ms
Lead Channel Sensing Intrinsic Amplitude: 6.5 mV
MDC IDC LEAD LOCATION: 753860
MDC IDC LEAD SERIAL: 49533838
MDC IDC MSMT LEADCHNL RV IMPEDANCE VALUE: 477 Ohm
MDC IDC MSMT LEADCHNL RV SENSING INTR AMPL: 23.4 mV
MDC IDC PG SERIAL: 60940552
MDC IDC SESS DTM: 20181220155352
MDC IDC SET LEADCHNL RV PACING AMPLITUDE: 2.5 V
MDC IDC SET LEADCHNL RV PACING PULSEWIDTH: 0.4 ms
MDC IDC SET LEADCHNL RV SENSING SENSITIVITY: 0.8 mV
MDC IDC STAT BRADY RV PERCENT PACED: 0 %

## 2016-12-30 MED ORDER — METOPROLOL SUCCINATE ER 50 MG PO TB24: 50.0000 mg | ORAL_TABLET | Freq: Every day | ORAL | 1 refills | Status: DC

## 2016-12-30 MED ORDER — METOPROLOL SUCCINATE ER 100 MG PO TB24: 100.0000 mg | ORAL_TABLET | Freq: Every day | ORAL | 1 refills | Status: DC

## 2016-12-30 NOTE — Patient Instructions (Addendum)
Medication Instructions: 1) STOP Metoprolol (Lopressor) 50mg   2) START Metoprolol XL 150 mg once daily  Labwork: None ordered  Procedures/Testing: None ordered  Follow-Up: Remote monitoring is used to monitor your Pacemaker of ICD from home. This monitoring reduces the number of office visits required to check your device to one time per year. It allows us to keep an eye on the functioning of your device to ensure it is working properly. You are scheduled for a device check from home on 03/30/2016. You may send your transmission at any time that day. If you have a wireless device, the transmission will be sent automatically. After your physician reviews your transmission, you will receive a postcard with your next transmission date.     Your physician wants you to follow-up in: 1 year with Dr. Elberta Fortisamnitz.  You will receive a reminder letter in the mail two months in advance. If you don't receive a letter, please call our office to schedule the follow-up appointment.     Any Additional Special Instructions Will Be Listed Below (If Applicable).     If you need a refill on your cardiac medications before your next appointment, please call your pharmacy.
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
## 2017-01-05 ENCOUNTER — Other Ambulatory Visit: Payer: Self-pay | Admitting: Internal Medicine

## 2017-01-06 ENCOUNTER — Ambulatory Visit: Payer: BLUE CROSS/BLUE SHIELD | Admitting: Internal Medicine

## 2017-01-12 ENCOUNTER — Ambulatory Visit: Payer: BLUE CROSS/BLUE SHIELD | Admitting: Family Medicine

## 2017-01-12 ENCOUNTER — Encounter: Payer: Self-pay | Admitting: Family Medicine

## 2017-01-12 ENCOUNTER — Ambulatory Visit: Payer: Self-pay | Admitting: *Deleted

## 2017-01-12 ENCOUNTER — Encounter: Payer: Self-pay | Admitting: Surgical

## 2017-01-12 VITALS — BP 138/82 | HR 98 | Temp 99.0°F | Resp 18 | Wt 276.8 lb

## 2017-01-12 DIAGNOSIS — R509 Fever, unspecified: Secondary | ICD-10-CM

## 2017-01-12 DIAGNOSIS — R05 Cough: Secondary | ICD-10-CM

## 2017-01-12 DIAGNOSIS — R059 Cough, unspecified: Secondary | ICD-10-CM

## 2017-01-12 LAB — CUP PACEART REMOTE DEVICE CHECK
Implantable Lead Location: 753860
Implantable Lead Serial Number: 49533838
Implantable Pulse Generator Implant Date: 20171102
MDC IDC LEAD IMPLANT DT: 20171102
MDC IDC SESS DTM: 20190102113107
Pulse Gen Model: 404633
Pulse Gen Serial Number: 60940552

## 2017-01-12 LAB — POC INFLUENZA A&B (BINAX/QUICKVUE)
Influenza A, POC: NEGATIVE
Influenza B, POC: NEGATIVE

## 2017-01-12 MED ORDER — GUAIFENESIN-CODEINE 100-10 MG/5ML PO SYRP: 10.0000 mL | ORAL_SOLUTION | Freq: Every evening | ORAL | 0 refills | Status: DC | PRN

## 2017-01-12 MED ORDER — LEVOFLOXACIN 500 MG PO TABS: 500.0000 mg | ORAL_TABLET | Freq: Every day | ORAL | 0 refills | Status: DC

## 2017-01-12 MED ORDER — AZITHROMYCIN 250 MG PO TABS
ORAL_TABLET | ORAL | 0 refills | Status: DC
Start: 2017-01-12 — End: 2017-02-16

## 2017-01-12 NOTE — Telephone Encounter (Signed)
Pt called with complaints of pain with inspiration on right side, shortness of breath and nasal congestion; per nurse triage recommendation was made for the pt to go to the ED but he refused; PEC agent already had pt scheduled for 1500 appointment today with Helane RimaErica Wallace; pt verbalizes understanding.  Reason for Disposition . [1] Longstanding difficulty breathing AND [2] not responding to usual therapy  Answer Assessment - Initial Assessment Questions 1. RESPIRATORY STATUS: "Describe your breathing?" (e.g., wheezing, shortness of breath, unable to speak, severe coughing)      Shortness of breath; pain on inspiration on right side  2. ONSET: "When did this breathing problem begin?"      01/10/17 3. PATTERN "Does the difficult breathing come and go, or has it been constant since it started?"      constant 4. SEVERITY: "How bad is your breathing?" (e.g., mild, moderate, severe)    - MILD: No SOB at rest, mild SOB with walking, speaks normally in sentences, can lay down, no retractions, pulse < 100.    - MODERATE: SOB at rest, SOB with minimal exertion and prefers to sit, cannot lie down flat, speaks in phrases, mild retractions, audible wheezing, pulse 100-120.    - SEVERE: Very SOB at rest, speaks in single words, struggling to breathe, sitting hunched forward, retractions, pulse > 120      severe 5. RECURRENT SYMPTOM: "Have you had difficulty breathing before?" If so, ask: "When was the last time?" and "What happened that time?"      Yes the past year 6. CARDIAC HISTORY: "Do you have any history of heart disease?" (e.g., heart attack, angina, bypass surgery, angioplasty)      chf 7. LUNG HISTORY: "Do you have any history of lung disease?"  (e.g., pulmonary embolus, asthma, emphysema)     no 8. CAUSE: "What do you think is causing the breathing problem?"      unsure 9. OTHER SYMPTOMS: "Do you have any other symptoms? (e.g., dizziness, runny nose, cough, chest pain, fever)     Periods of  chills, congestion 10. PREGNANCY: "Is there any chance you are pregnant?" "When was your last menstrual period?"      n/a 11. TRAVEL: "Have you traveled out of the country in the last month?" (e.g., travel history, exposures)       no  Protocols used: BREATHING DIFFICULTY-A-AH

## 2017-01-12 NOTE — Telephone Encounter (Signed)
Called patient was able to have conversation with me about symptoms with no trouble breathing. States that he does not have any new or increased edema. He has had cough. He does not feel like it is cardiac at all. Would like to keep app this PM.

## 2017-01-12 NOTE — Progress Notes (Signed)
William Rubio is a 42 y.o. male here for an acute visit.  History of Present Illness:   Barnie MortJoEllen Thompson, CMA acting as scribe for Dr. Earlene PlaterWallace.   Cough  This is a new problem. The current episode started in the past 7 days. The problem has been gradually worsening. The problem occurs every few minutes. The cough is non-productive. Associated symptoms include chills, ear congestion, a fever, nasal congestion, shortness of breath and sweats. Pertinent negatives include no chest pain, ear pain, headaches or wheezing. The symptoms are aggravated by lying down. He has tried OTC cough suppressant for the symptoms. The treatment provided no relief. There is no history of asthma or COPD.   Patient is also having pain in chest with deep breath.   PMHx, SurgHx, SocialHx, Medications, and Allergies were reviewed in the Visit Navigator and updated as appropriate.  Current Medications:   .  aspirin EC 81 MG tablet, Take 81 mg by mouth daily., Disp: , Rfl:  .  furosemide (LASIX) 20 MG tablet, Take 1 tablet (20 mg total) by mouth every Monday, Wednesday, and Friday., Disp: 90 tablet, Rfl: 0 .  lisinopril (PRINIVIL,ZESTRIL) 10 MG tablet, Take 1 tablet (10 mg total) daily by mouth., Disp: 90 tablet, Rfl: 3 .  metoprolol succinate (TOPROL-XL) 100 MG 24 hr tablet, Take 1 tablet (100 mg total) by mouth daily. Take with or immediately following a meal., Disp: 90 tablet, Rfl: 1 .  metoprolol succinate (TOPROL-XL) 50 MG 24 hr tablet, Take 1 tablet (50 mg total) by mouth daily. Take with or immediately following a meal., Disp: 90 tablet, Rfl: 1 .  pravastatin (PRAVACHOL) 80 MG tablet, Take 1 tablet (80 mg total) by mouth daily., Disp: 90 tablet, Rfl: 3 .  ranitidine (ZANTAC) 150 MG capsule, Take 150 mg by mouth 2 (two) times daily as needed for heartburn., Disp: , Rfl:    Allergies  Allergen Reactions  . Penicillins Rash    Rash as both a kid and as an adult  . Other     Bee stings  . Latex Rash    Review of Systems:   Pertinent items are noted in the HPI. Otherwise, ROS is negative.  Vitals:   Vitals:   01/12/17 1310  BP: 138/82  Pulse: 98  Resp: 18  Temp: 99 F (37.2 C)  TempSrc: Oral  SpO2: 95%  Weight: 276 lb 12.8 oz (125.6 kg)     Body mass index is 39.72 kg/m.  Physical Exam:   Physical Exam  Constitutional: He is oriented to person, place, and time. He appears well-developed and well-nourished. No distress.  HENT:  Head: Normocephalic and atraumatic.  Right Ear: External ear normal.  Left Ear: External ear normal.  Nose: Nose normal.  Mouth/Throat: Oropharynx is clear and moist.  Eyes: Conjunctivae and EOM are normal. Pupils are equal, round, and reactive to light.  Neck: Normal range of motion. Neck supple.  Cardiovascular: Normal rate, regular rhythm and intact distal pulses.  Pulmonary/Chest: Effort normal. No respiratory distress. He exhibits tenderness.  Abdominal: Soft. Bowel sounds are normal.  Musculoskeletal: Normal range of motion.  Neurological: He is alert and oriented to person, place, and time.  Skin: Skin is warm and dry.  Psychiatric: He has a normal mood and affect. His behavior is normal. Judgment and thought content normal.  Nursing note and vitals reviewed.   Assessment and Plan:   1. Fever, cough, likely due to viral illness  - POC Influenza A&B(BINAX/QUICKVUE) - guaiFENesin-codeine (  CHERATUSSIN AC) 100-10 MG/5ML syrup; Take 10 mLs by mouth at bedtime as needed for cough or congestion.  Dispense: 120 mL; Refill: 0 - azithromycin (ZITHROMAX Z-PAK) 250 MG tablet; Please take 2 tablets the first day and 1 tablet daily until finished.  Dispense: 6 each; Refill: 0 (SAFETY NET RX ONLY - TO HOLD)  . Reviewed expectations re: course of current medical issues. . Discussed self-management of symptoms. . Outlined signs and symptoms indicating need for more acute intervention. . Patient verbalized understanding and all questions were  answered. Marland Kitchen Health Maintenance issues including appropriate healthy diet, exercise, and smoking avoidance were discussed with patient. . See orders for this visit as documented in the electronic medical record. . Patient received an After Visit Summary.  CMA served as Neurosurgeon during this visit. History, Physical, and Plan performed by medical provider. The above documentation has been reviewed and is accurate and complete. Helane Rima, D.O.   Helane Rima, DO [place], [facility] 01/12/2017

## 2017-01-12 NOTE — Telephone Encounter (Signed)
See note

## 2017-02-02 ENCOUNTER — Encounter: Payer: BLUE CROSS/BLUE SHIELD | Admitting: Cardiology

## 2017-02-16 ENCOUNTER — Telehealth: Payer: Self-pay | Admitting: Family Medicine

## 2017-02-16 ENCOUNTER — Ambulatory Visit: Payer: BLUE CROSS/BLUE SHIELD | Admitting: Family Medicine

## 2017-02-16 ENCOUNTER — Ambulatory Visit (INDEPENDENT_AMBULATORY_CARE_PROVIDER_SITE_OTHER): Payer: BLUE CROSS/BLUE SHIELD

## 2017-02-16 ENCOUNTER — Encounter: Payer: Self-pay | Admitting: Family Medicine

## 2017-02-16 VITALS — BP 124/82 | HR 74 | Temp 98.3°F | Ht 70.0 in | Wt 272.0 lb

## 2017-02-16 DIAGNOSIS — R05 Cough: Secondary | ICD-10-CM

## 2017-02-16 DIAGNOSIS — R059 Cough, unspecified: Secondary | ICD-10-CM

## 2017-02-16 MED ORDER — BUDESONIDE-FORMOTEROL FUMARATE 80-4.5 MCG/ACT IN AERO: 2.0000 | INHALATION_SPRAY | Freq: Two times a day (BID) | RESPIRATORY_TRACT | 0 refills | Status: DC

## 2017-02-16 MED ORDER — BUDESONIDE-FORMOTEROL FUMARATE 80-4.5 MCG/ACT IN AERO: 2.0000 | INHALATION_SPRAY | Freq: Two times a day (BID) | RESPIRATORY_TRACT | 3 refills | Status: DC

## 2017-02-16 MED ORDER — LEVOFLOXACIN 500 MG PO TABS: 500.0000 mg | ORAL_TABLET | Freq: Every day | ORAL | 0 refills | Status: DC

## 2017-02-16 MED ORDER — BUDESONIDE-FORMOTEROL FUMARATE 160-4.5 MCG/ACT IN AERO: 2.0000 | INHALATION_SPRAY | Freq: Two times a day (BID) | RESPIRATORY_TRACT | 3 refills | Status: DC

## 2017-02-16 NOTE — Telephone Encounter (Signed)
Spoke with patient and he said that you was going to give him ten days worth of ABX. Only seven days worth was sent in. Do you want him to take for ten days.

## 2017-02-16 NOTE — Telephone Encounter (Signed)
Three more doses sent to the pharmacy. Notified patient that the extended doses have been sent to the pharmacy.

## 2017-02-16 NOTE — Telephone Encounter (Signed)
Copied from CRM [id number]. Topic: Quick Communication - Rx Refill/Question >> Feb 16, 2017 12:12 PM Alexander BergeronBarksdale, Harvey B wrote: Medication: levofloxacin (LEVAQUIN) 500 MG tablet [[id number]][[id number]]    Has the patient contacted their pharmacy? Yes.    Pt states it was talked about getting a 10 day script but received a 7 day, contact pt to advise  (Agent: If no, request that the patient contact the pharmacy for the refill.)   Preferred Pharmacy (with phone number or street name): [facility]   Agent: Please be advised that RX refills may take up to 3 business days. We ask that you follow-up with your pharmacy.

## 2017-02-16 NOTE — Telephone Encounter (Signed)
Please advise 

## 2017-02-16 NOTE — Telephone Encounter (Signed)
Correct to 10 please.

## 2017-02-16 NOTE — Progress Notes (Signed)
William MullJoseph Rubio is a 42 y.o. male here for an acute visit.  History of Present Illness:  William BottomJamie Rubio CMA acting as scribe for Dr. Earlene PlaterWallace.  HPI: Patient comes in today for acute issue. He stated that he has been having recurrent wheezing and shortness of breath. He is producing a lot of mucus. He was seen 01/12/17 for similar symptoms and was giving Zpak. He stated that he got better and then symptoms came back. He is concerned that he may have COPD.    PMHx, SurgHx, SocialHx, Medications, and Allergies were reviewed in the Visit Navigator and updated as appropriate.  Current Medications:   .  aspirin EC 81 MG tablet, Take 81 mg by mouth daily., Disp: , Rfl:  .  metoprolol succinate (TOPROL-XL) 100 MG 24 hr tablet, Take 1 tablet (100 mg total) by mouth daily. Take with or immediately following a meal., Disp: 90 tablet, Rfl: 1 .  metoprolol succinate (TOPROL-XL) 50 MG 24 hr tablet, Take 1 tablet (50 mg total) by mouth daily. Take with or immediately following a meal., Disp: 90 tablet, Rfl: 1 .  pravastatin (PRAVACHOL) 80 MG tablet, Take 1 tablet (80 mg total) by mouth daily., Disp: 90 tablet, Rfl: 3 .  ranitidine (ZANTAC) 150 MG capsule, Take 150 mg by mouth 2 (two) times daily as needed for heartburn., Disp: , Rfl:  .  furosemide (LASIX) 20 MG tablet, Take 1 tablet (20 mg total) by mouth every Monday, Wednesday, and Friday., Disp: 90 tablet, Rfl: 0 .  lisinopril (PRINIVIL,ZESTRIL) 10 MG tablet, Take 1 tablet (10 mg total) daily by mouth., Disp: 90 tablet, Rfl: 3   Allergies  Allergen Reactions  . Penicillins Rash    Rash as both a kid and as an adult  . Other     Bee stings  . Latex Rash   Review of Systems:   Pertinent items are noted in the HPI. Otherwise, ROS is negative.  Vitals:   Vitals:   02/16/17 1045  BP: 124/82  Pulse: 74  Temp: 98.3 F (36.8 C)  TempSrc: Oral  SpO2: 94%  Weight: 272 lb (123.4 kg)  Height: 5\' 10"  (1.778 m)     Body mass index is 39.03  kg/m.  Physical Exam:   Physical Exam  Constitutional: He is oriented to person, place, and time. He appears well-developed and well-nourished. No distress.  HENT:  Head: Normocephalic and atraumatic.  Right Ear: External ear normal.  Left Ear: External ear normal.  Nose: Mucosal edema and rhinorrhea present. Right sinus exhibits maxillary sinus tenderness and frontal sinus tenderness.  Mouth/Throat: Oropharynx is clear and moist.  Eyes: Conjunctivae and EOM are normal. Pupils are equal, round, and reactive to light.  Neck: Normal range of motion. Neck supple.  Cardiovascular: Normal rate, regular rhythm, normal heart sounds and intact distal pulses.  Pulmonary/Chest: Effort normal. He has decreased breath sounds.  Abdominal: Soft. Bowel sounds are normal.  Musculoskeletal: Normal range of motion.  Neurological: He is alert and oriented to person, place, and time.  Skin: Skin is warm and dry.  Psychiatric: He has a normal mood and affect. His behavior is normal. Judgment and thought content normal.  Nursing note and vitals reviewed.   Results for orders placed or performed in visit on 01/12/17  POC Influenza A&B(BINAX/QUICKVUE)  Result Value Ref Range   Influenza A, POC Negative Negative   Influenza B, POC Negative Negative   EXAM: CHEST  2 VIEW  COMPARISON:  11/02/2016  FINDINGS:  Cardiac shadow is mildly enlarged. Postsurgical changes are seen. Defibrillator is again noted and stable. No focal infiltrate or sizable effusion is seen. Degenerative changes of the thoracic spine are noted.  IMPRESSION: No active cardiopulmonary disease.  Assessment and Plan:   1. Cough No changes on CXR. Most c/w mild COPD/RAD. Inhaler as below to be used for the next month. Okay Abx - sent to pharmacy as needed for sinusitis.   - DG Chest 2 View - budesonide-formoterol (SYMBICORT) 160-4.5 MCG/ACT inhaler; Inhale 2 puffs into the lungs 2 (two) times daily.  Dispense: 1 Inhaler;  Refill: 3   . Reviewed expectations re: course of current medical issues. . Discussed self-management of symptoms. . Outlined signs and symptoms indicating need for more acute intervention. . Patient verbalized understanding and all questions were answered. Marland Kitchen Health Maintenance issues including appropriate healthy diet, exercise, and smoking avoidance were discussed with patient. . See orders for this visit as documented in the electronic medical record. . Patient received an After Visit Summary.  CMA served as Neurosurgeon during this visit. History, Physical, and Plan performed by medical provider. The above documentation has been reviewed and is accurate and complete. Helane Rima, D.O.  Helane Rima, DO [place], [facility] 02/19/2017

## 2017-02-18 ENCOUNTER — Other Ambulatory Visit: Payer: Self-pay | Admitting: Physician Assistant

## 2017-02-18 MED ORDER — FUROSEMIDE 20 MG PO TABS: 20.0000 mg | ORAL_TABLET | ORAL | 3 refills | Status: DC

## 2017-02-18 NOTE — Telephone Encounter (Signed)
Pt's medication was sent to pt's pharmacy as requested. Confirmation received.  °

## 2017-02-18 NOTE — Telephone Encounter (Signed)
°*  STAT* If patient is at the pharmacy, call can be transferred to refill team.   1. Which medications need to be refilled? (please list name of each medication and dose if known)need new pharmacy for his Furosemide  2. Which pharmacy/location (including street and city if local pharmacy) is medication to be sent to?[facility] [phone]  3. Do they need a 30 day or 90 day supply? 60 and refills

## 2017-02-19 ENCOUNTER — Encounter: Payer: Self-pay | Admitting: Family Medicine

## 2017-03-29 ENCOUNTER — Telehealth: Payer: Self-pay | Admitting: *Deleted

## 2017-03-29 NOTE — Telephone Encounter (Signed)
NSVT episode from 02/11/17 placed in red folder for Dr. Gershon Craneamnitz's review. Received back yesterday with recommendations to increase Toprol XL 150mg  daily to 200mg  daily. Mr. William Rubio reports that he was feeling dizzy with position changes and getting low BP readings (104/62) and having difficulty hearing pulse while checking BP. He decreased his dose about 3-4 weeks ago to 100mg  daily and is feeling better. I will review with Dr. Elberta Fortisamnitz while he is in the office tomorrow and call Mr. William Rubio tomorrow. He verbalizes understanding and is appreciative.

## 2017-03-30 ENCOUNTER — Ambulatory Visit (INDEPENDENT_AMBULATORY_CARE_PROVIDER_SITE_OTHER): Payer: BLUE CROSS/BLUE SHIELD | Admitting: *Deleted

## 2017-03-30 DIAGNOSIS — I255 Ischemic cardiomyopathy: Secondary | ICD-10-CM

## 2017-03-30 NOTE — Progress Notes (Signed)
Remote ICD transmission.   

## 2017-03-30 NOTE — Telephone Encounter (Signed)
Reviewed with Dr. Elberta Fortisamnitz- no changes, continue Toprol XL 100mg  daily and we will continue to monitor for VT.  LMOM (per DPR) with instructions and call back number if needed.

## 2017-03-31 ENCOUNTER — Encounter: Payer: Self-pay | Admitting: Cardiology

## 2017-04-11 NOTE — Progress Notes (Signed)
William Rubio is a 42 y.o. male is here FOR AN ACUTE VISIT.  History of Present Illness:   HPI: Patient comes in today for lower back pain since Friday. Patient was pretending to do karate kicks with a friend.  He noticed pain in his right lower back that has continued and worsened over the past few days.  He has tried anti-inflammatories and rest.  No history of lower back trauma.  No radiation of pain.  No bowel or bladder incontinence.  There are no preventive care reminders to display for this patient.   Depression screen PHQ 2/9 06/08/2016  Decreased Interest 0  Down, Depressed, Hopeless 0  PHQ - 2 Score 0   Review of Systems  Constitutional: Negative for chills and fever.  HENT: Negative for congestion and sinus pain.   Eyes: Negative for blurred vision and double vision.  Respiratory: Negative for cough and shortness of breath.   Cardiovascular: Negative for chest pain and palpitations.  Gastrointestinal: Negative for nausea and vomiting.  Genitourinary: Negative for dysuria and hematuria.  Musculoskeletal: Positive for back pain. Negative for neck pain.  Neurological: Negative for dizziness and headaches.  Psychiatric/Behavioral: Negative for depression and suicidal ideas.    PMHx, SurgHx, SocialHx, FamHx, Medications, and Allergies were reviewed in the Visit Navigator and updated as appropriate.   Patient Active Problem List   Diagnosis Date Noted  . COPD GOLD II  barely meets criteria  08/04/2016  . Dyspnea on exertion 08/03/2016  . Essential hypertension 07/02/2016  . Hyperlipidemia 07/02/2016  . ICD (implantable cardioverter-defibrillator) in place 06/18/2016  . Gastroesophageal reflux disease 06/11/2016  . Morbid obesity due to excess calories (HCC) 06/11/2016  . Sleep-disordered breathing 06/11/2016  . CAD (coronary artery disease) 04/05/2016  . Chronic systolic heart failure (HCC) 04/05/2016  . Pleurisy with effusion 04/05/2016  . History of MI  (myocardial infarction)    Social History   Tobacco Use  . Smoking status: Former Smoker    Packs/day: 1.00    Years: 20.00    Pack years: 20.00    Types: Cigarettes    Last attempt to quit: 06/23/2013    Years since quitting: 3.8  . Smokeless tobacco: Never Used  Substance Use Topics  . Alcohol use: Yes    Comment: occasional  . Drug use: No   Current Medications and Allergies:   .  aspirin EC 81 MG tablet, Take 81 mg by mouth daily., Disp: , Rfl:  .  budesonide-formoterol (SYMBICORT) 160-4.5 MCG/ACT inhaler, Inhale 2 puffs into the lungs 2 (two) times daily., Disp: 1 Inhaler, Rfl: 3 .  budesonide-formoterol (SYMBICORT) 80-4.5 MCG/ACT inhaler, Inhale 2 puffs into the lungs 2 (two) times daily., Disp: 1 Inhaler, Rfl: 3 .  budesonide-formoterol (SYMBICORT) 80-4.5 MCG/ACT inhaler, Inhale 2 puffs into the lungs 2 (two) times daily., Disp: 1 Inhaler, Rfl: 0 .  furosemide (LASIX) 20 MG tablet, Take 1 tablet (20 mg total) by mouth every Monday, Wednesday, and Friday., Disp: 45 tablet, Rfl: 3 .  lisinopril (PRINIVIL,ZESTRIL) 10 MG tablet, Take 1 tablet (10 mg total) daily by mouth., Disp: 90 tablet, Rfl: 3 .  metoprolol succinate (TOPROL-XL) 100 MG 24 hr tablet, Take 1 tablet (100 mg total) by mouth daily. Take with or immediately following a meal., Disp: 90 tablet, Rfl: 1 .  pravastatin (PRAVACHOL) 80 MG tablet, Take 1 tablet (80 mg total) by mouth daily., Disp: 90 tablet, Rfl: 3 .  ranitidine (ZANTAC) 150 MG capsule, Take 150 mg by mouth 2 (  two) times daily as needed for heartburn., Disp: , Rfl:    Allergies  Allergen Reactions  . Penicillins Rash    Rash as both a kid and as an adult  . Other     Bee stings  . Latex Rash   Review of Systems   Pertinent items are noted in the HPI. Otherwise, ROS is negative.  Vitals:   Vitals:   04/12/17 0803  BP: 124/86  Pulse: 71  Temp: 97.8 F (36.6 C)  TempSrc: Oral  SpO2: 97%  Weight: 276 lb (125.2 kg)  Height: 5\' 10"  (1.778 m)      Body mass index is 39.6 kg/m.   Physical Exam:   Physical Exam  Constitutional: He is oriented to person, place, and time. He appears well-developed and well-nourished. No distress.  HENT:  Head: Normocephalic and atraumatic.  Right Ear: External ear normal.  Left Ear: External ear normal.  Nose: Nose normal.  Mouth/Throat: Oropharynx is clear and moist.  Eyes: Pupils are equal, round, and reactive to light. Conjunctivae and EOM are normal.  Neck: Normal range of motion. Neck supple.  Cardiovascular: Normal rate, regular rhythm and intact distal pulses.  Pulmonary/Chest: Effort normal and breath sounds normal.  Abdominal: Soft. Bowel sounds are normal.  Musculoskeletal:       Lumbar back: He exhibits decreased range of motion and spasm.       Back:  Neurological: He is alert and oriented to person, place, and time.  Skin: Skin is warm and dry.  Psychiatric: He has a normal mood and affect. His behavior is normal. Judgment and thought content normal.  Nursing note and vitals reviewed.    Assessment and Plan:   William LongsJoseph was seen today for back pain.  Diagnoses and all orders for this visit:  Strain of lumbar region, initial encounter -     cyclobenzaprine (FLEXERIL) 5 MG tablet; Take 1 tablet (5 mg total) by mouth 3 (three) times daily as needed for muscle spasms. -     HYDROcodone-acetaminophen (NORCO/VICODIN) 5-325 MG tablet; Take 1 tablet by mouth every 6 (six) hours as needed for moderate pain.   . Reviewed expectations re: course of current medical issues. . Discussed self-management of symptoms. . Outlined signs and symptoms indicating need for more acute intervention. . Patient verbalized understanding and all questions were answered. Marland Kitchen. Health Maintenance issues including appropriate healthy diet, exercise, and smoking avoidance were discussed with patient. . See orders for this visit as documented in the electronic medical record. . Patient received an After Visit  Summary.  William RimaErica Zan Orlick, DO [place] 04/12/2017  Future Appointments  Date Time Provider Department Center  04/12/2017  8:20 AM William RimaWallace, William Porreca, DO [facility]  06/29/2017  8:20 AM [facility] DEVICE REMOTES [facility]

## 2017-04-12 ENCOUNTER — Encounter: Payer: Self-pay | Admitting: Surgical

## 2017-04-12 ENCOUNTER — Ambulatory Visit: Payer: BLUE CROSS/BLUE SHIELD | Admitting: Family Medicine

## 2017-04-12 ENCOUNTER — Encounter: Payer: Self-pay | Admitting: Family Medicine

## 2017-04-12 VITALS — BP 124/86 | HR 71 | Temp 97.8°F | Ht 70.0 in | Wt 276.0 lb

## 2017-04-12 DIAGNOSIS — S39012A Strain of muscle, fascia and tendon of lower back, initial encounter: Secondary | ICD-10-CM

## 2017-04-12 MED ORDER — HYDROCODONE-ACETAMINOPHEN 5-325 MG PO TABS: 1.0000 | ORAL_TABLET | Freq: Four times a day (QID) | ORAL | 0 refills | Status: DC | PRN

## 2017-04-12 MED ORDER — CYCLOBENZAPRINE HCL 5 MG PO TABS: 5.0000 mg | ORAL_TABLET | Freq: Three times a day (TID) | ORAL | 0 refills | Status: DC | PRN

## 2017-04-19 ENCOUNTER — Telehealth: Payer: Self-pay

## 2017-04-19 NOTE — Telephone Encounter (Signed)
Copied from CRM [phone]. Topic: Quick Communication - See Telephone Encounter >> Apr 19, 2017  3:35 PM Floria RavelingStovall, Shana A wrote: CRM for notification. See Telephone encounter for: 04/19/17. Pt called in and would like to know if Dr Earlene Platerwallace could give him Tramadol instead of the HYDROcodone-acetaminophen (NORCO/VICODIN) 5-325 MG tablet [[id number]. He said that the Norco makes him feel sick .    Pharamcy - [facility] on [address]

## 2017-04-19 NOTE — Telephone Encounter (Signed)
Please advise 

## 2017-04-20 MED ORDER — TRAMADOL HCL 50 MG PO TABS: 50.0000 mg | ORAL_TABLET | Freq: Three times a day (TID) | ORAL | 0 refills | Status: DC | PRN

## 2017-04-20 NOTE — Telephone Encounter (Signed)
Completed.

## 2017-04-22 LAB — CUP PACEART REMOTE DEVICE CHECK
Date Time Interrogation Session: 20190412094933
Implantable Lead Location: 753860
MDC IDC LEAD IMPLANT DT: 20171102
MDC IDC LEAD SERIAL: 49533838
MDC IDC PG IMPLANT DT: 20171102
MDC IDC PG SERIAL: 60940552
Pulse Gen Model: 404633

## 2017-05-03 ENCOUNTER — Other Ambulatory Visit: Payer: Self-pay | Admitting: Cardiology

## 2017-05-03 ENCOUNTER — Other Ambulatory Visit: Payer: Self-pay | Admitting: Physician Assistant

## 2017-06-01 ENCOUNTER — Other Ambulatory Visit: Payer: Self-pay | Admitting: Cardiology

## 2017-06-02 ENCOUNTER — Telehealth: Payer: Self-pay | Admitting: Physician Assistant

## 2017-06-02 NOTE — Telephone Encounter (Signed)
Asked patient to send manual transmission before determination can be made.  Explained that we would look at transmission first to see if device needs any reprogramming/changes before advising. Pt is agreeable to plan.  He will call me in the morning if he has issues transmitting.

## 2017-06-02 NOTE — Telephone Encounter (Signed)
New Message   Patient is calling because his overall complaint is that he is fatigue. Sleeping 8 hours but still feeling tired. He recalls the last time he felt like this he went to the emergency room and ended up having surgery and getting a defibrillator. Please call.

## 2017-06-02 NOTE — Telephone Encounter (Signed)
lpmtcb 5/23 

## 2017-06-02 NOTE — Telephone Encounter (Signed)
Follow up  ° ° °Patient is returning call.  °

## 2017-06-09 NOTE — Telephone Encounter (Signed)
Informed patient that device check did not reveal anything concerning or that would cause reported symptom. Pt advised to keep appt on 6/11 w/ Tereso Newcomer for further evaluation. Patient verbalized understanding and agreeable to plan.

## 2017-06-21 ENCOUNTER — Encounter: Payer: Self-pay | Admitting: Physician Assistant

## 2017-06-21 ENCOUNTER — Ambulatory Visit: Payer: BLUE CROSS/BLUE SHIELD | Admitting: Physician Assistant

## 2017-06-21 ENCOUNTER — Encounter: Payer: Self-pay | Admitting: *Deleted

## 2017-06-21 ENCOUNTER — Encounter

## 2017-06-21 VITALS — BP 128/80 | HR 58 | Ht 70.0 in | Wt 265.2 lb

## 2017-06-21 DIAGNOSIS — I4729 Other ventricular tachycardia: Secondary | ICD-10-CM

## 2017-06-21 DIAGNOSIS — R0683 Snoring: Secondary | ICD-10-CM

## 2017-06-21 DIAGNOSIS — I472 Ventricular tachycardia: Secondary | ICD-10-CM

## 2017-06-21 DIAGNOSIS — I5022 Chronic systolic (congestive) heart failure: Secondary | ICD-10-CM

## 2017-06-21 DIAGNOSIS — I25810 Atherosclerosis of coronary artery bypass graft(s) without angina pectoris: Secondary | ICD-10-CM | POA: Diagnosis not present

## 2017-06-21 DIAGNOSIS — R0602 Shortness of breath: Secondary | ICD-10-CM

## 2017-06-21 DIAGNOSIS — I480 Paroxysmal atrial fibrillation: Secondary | ICD-10-CM

## 2017-06-21 DIAGNOSIS — E785 Hyperlipidemia, unspecified: Secondary | ICD-10-CM

## 2017-06-21 DIAGNOSIS — R5383 Other fatigue: Secondary | ICD-10-CM

## 2017-06-21 MED ORDER — FUROSEMIDE 20 MG PO TABS: 20.0000 mg | ORAL_TABLET | Freq: Every day | ORAL | 3 refills | Status: DC
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
## 2017-06-21 NOTE — Patient Instructions (Signed)
Medication Instructions:  1. AN RX FOR LASIX 20 MG DAILY HAS BEEN SENT IN; THOUGH YOU WILL CONTINUE TO TAKE LASIX 3 TIMES PER WEEK  Labwork: 1. TODAY BMET, CBC, TSH, LFT, PRO BNP  2. WHEN YOU COME IN FOR YOUR STRESS TEST YOU CAN FASTING LIPID PANEL DONE SAME DAY  Testing/Procedures: 1. Your physician has requested that you have en exercise stress myoview. For further information please visit [email or web address]. Please follow instruction sheet, as given.  2. Your physician has requested that you have an echocardiogram. Echocardiography is a painless test that uses sound waves to create images of your heart. It provides your doctor with information about the size and shape of your heart and how well your heart's chambers and valves are working. This procedure takes approximately one hour. There are no restrictions for this procedure.  3. Your physician has recommended that you have a sleep study. This test records several body functions during sleep, including: brain activity, eye movement, oxygen and carbon dioxide blood levels, heart rate and rhythm, breathing rate and rhythm, the flow of air through your mouth and nose, snoring, body muscle movements, and chest and belly movement. NINA JONES, CMA WILL CALL YOU TO SCHEDULE YOU FOR THE SLEEP STUDY    Follow-Up: SCOTT WEAVER, PAC IN 1 MONTH; IF POSSIBLE SAME DAY DR. Excell SeltzerOOPER IS IN THE OFFICE   Any Other Special Instructions Will Be Listed Below (If Applicable).     If you need a refill on your cardiac medications before your next appointment, please call your pharmacy.

## 2017-06-22 ENCOUNTER — Telehealth: Payer: Self-pay | Admitting: *Deleted

## 2017-06-22 ENCOUNTER — Other Ambulatory Visit: Payer: Self-pay

## 2017-06-22 LAB — BASIC METABOLIC PANEL
BUN/Creatinine Ratio: 18 (ref 9–20)
BUN: 14 mg/dL (ref 6–24)
CALCIUM: 10.5 mg/dL — AB (ref 8.7–10.2)
CHLORIDE: 100 mmol/L (ref 96–106)
CO2: 24 mmol/L (ref 20–29)
Creatinine, Ser: 0.79 mg/dL (ref 0.76–1.27)
GFR calc Af Amer: 127 mL/min/{1.73_m2} (ref >59)
GFR, EST NON AFRICAN AMERICAN: 110 mL/min/{1.73_m2} (ref >59)
Glucose: 86 mg/dL (ref 65–99)
POTASSIUM: 5.1 mmol/L (ref 3.5–5.2)
Sodium: 140 mmol/L (ref 134–144)

## 2017-06-22 LAB — CBC
HEMATOCRIT: 45.2 % (ref 37.5–51.0)
HEMOGLOBIN: 15.5 g/dL (ref 13.0–17.7)
MCH: 30.8 pg (ref 26.6–33.0)
MCHC: 34.3 g/dL (ref 31.5–35.7)
MCV: 90 fL (ref 79–97)
Platelets: 499 10*3/uL — ABNORMAL HIGH (ref 150–450)
RBC: 5.03 x10E6/uL (ref 4.14–5.80)
RDW: 14.4 % (ref 12.3–15.4)
WBC: 10.3 10*3/uL (ref 3.4–10.8)

## 2017-06-22 LAB — HEPATIC FUNCTION PANEL
ALK PHOS: 81 IU/L (ref 39–117)
ALT: 27 IU/L (ref 0–44)
AST: 23 IU/L (ref 0–40)
Albumin: 5.1 g/dL (ref 3.5–5.5)
BILIRUBIN TOTAL: 0.4 mg/dL (ref 0.0–1.2)
Bilirubin, Direct: 0.12 mg/dL (ref 0.00–0.40)
TOTAL PROTEIN: 7.9 g/dL (ref 6.0–8.5)

## 2017-06-22 LAB — TSH: TSH: 0.906 u[IU]/mL (ref 0.450–4.500)

## 2017-06-22 LAB — PRO B NATRIURETIC PEPTIDE: NT-Pro BNP: 101 pg/mL — ABNORMAL HIGH (ref 0–86)

## 2017-06-22 NOTE — Telephone Encounter (Signed)
-----   Message from Tarri Fullerarol M Fiato, CMA sent at 06/21/2017  3:01 PM EDT ----- Regarding: SPLIT NIGHT SLEEP  Hi Nina,  Pt saw Lorin PicketScott today and needs to be scheduled for a split night sleep study. Pt filled out sleep form also, which I laid on your chair.   Thank you Okey Regalarol

## 2017-06-22 NOTE — Telephone Encounter (Signed)
Sent to sleep pool. 

## 2017-06-23 ENCOUNTER — Telehealth: Payer: Self-pay | Admitting: *Deleted

## 2017-06-23 NOTE — Telephone Encounter (Signed)
Patient notified [employer] approved sleep study. Appointment scheduled  On 07/16/17.patient states he prefers not to go on this day. [facility] contact information provided to patient for rescheduling.

## 2017-06-27 ENCOUNTER — Telehealth (HOSPITAL_COMMUNITY): Payer: Self-pay | Admitting: *Deleted

## 2017-06-27 NOTE — Telephone Encounter (Signed)
Patient given detailed instructions per Myocardial Perfusion Study Information Sheet for the test on 06/30/17 at 1030. Patient notified to arrive 15 minutes early and that it is imperative to arrive on time for appointment to keep from having the test rescheduled.  If you need to cancel or reschedule your appointment, please call the office within 24 hours of your appointment. . Patient verbalized understanding.Afua Hoots, Adelene IdlerCynthia W

## 2017-06-29 ENCOUNTER — Ambulatory Visit (INDEPENDENT_AMBULATORY_CARE_PROVIDER_SITE_OTHER): Payer: BLUE CROSS/BLUE SHIELD | Admitting: *Deleted

## 2017-06-29 DIAGNOSIS — I5022 Chronic systolic (congestive) heart failure: Secondary | ICD-10-CM

## 2017-06-29 DIAGNOSIS — I255 Ischemic cardiomyopathy: Secondary | ICD-10-CM

## 2017-06-29 NOTE — Progress Notes (Signed)
Remote ICD transmission.   

## 2017-06-30 ENCOUNTER — Other Ambulatory Visit: Payer: BLUE CROSS/BLUE SHIELD

## 2017-06-30 ENCOUNTER — Encounter (HOSPITAL_COMMUNITY): Payer: BLUE CROSS/BLUE SHIELD

## 2017-06-30 ENCOUNTER — Telehealth: Payer: Self-pay | Admitting: Family Medicine

## 2017-06-30 ENCOUNTER — Other Ambulatory Visit (HOSPITAL_COMMUNITY): Payer: BLUE CROSS/BLUE SHIELD

## 2017-06-30 NOTE — Telephone Encounter (Signed)
Copied from CRM [phone]. Topic: General - Other >> Jun 30, 2017  1:39 PM Elliot GaultBell, Tiffany M wrote:  Relation to pt: self Call back number: [phone] [facility]acy: [id number] [place] - [id number] [phone] (Phone) [phone] (Fax)    Reason for call:  Patient experiencing respiratory infection for the last 3 days, patient states its on going and PCP has sent in Rx in the past, patient would like a Rx and denied appointment, please advise

## 2017-07-01 NOTE — Telephone Encounter (Signed)
Left message for patient to call the office to schedule an appointment. He would need to be seen for this.

## 2017-07-04 ENCOUNTER — Encounter: Payer: Self-pay | Admitting: *Deleted

## 2017-07-04 NOTE — Telephone Encounter (Signed)
Patient never returned call or scheduled an apppointment.

## 2017-07-05 ENCOUNTER — Other Ambulatory Visit (HOSPITAL_COMMUNITY): Payer: BLUE CROSS/BLUE SHIELD

## 2017-07-06 ENCOUNTER — Telehealth (HOSPITAL_COMMUNITY): Payer: Self-pay | Admitting: *Deleted

## 2017-07-06 ENCOUNTER — Other Ambulatory Visit (HOSPITAL_COMMUNITY): Payer: BLUE CROSS/BLUE SHIELD

## 2017-07-06 NOTE — Telephone Encounter (Signed)
Patient given detailed instructions per Myocardial Perfusion Study Information Sheet for the test on 07/12/17. Patient notified to arrive 15 minutes early and that it is imperative to arrive on time for appointment to keep from having the test rescheduled.  If you need to cancel or reschedule your appointment, please call the office within 24 hours of your appointment. . Patient verbalized understanding. William Rubio    

## 2017-07-08 ENCOUNTER — Encounter (HOSPITAL_COMMUNITY): Payer: BLUE CROSS/BLUE SHIELD

## 2017-07-12 ENCOUNTER — Ambulatory Visit (HOSPITAL_COMMUNITY): Payer: BLUE CROSS/BLUE SHIELD | Attending: Internal Medicine

## 2017-07-12 ENCOUNTER — Other Ambulatory Visit: Payer: Self-pay

## 2017-07-12 ENCOUNTER — Other Ambulatory Visit: Payer: BLUE CROSS/BLUE SHIELD | Admitting: *Deleted

## 2017-07-12 ENCOUNTER — Ambulatory Visit (HOSPITAL_BASED_OUTPATIENT_CLINIC_OR_DEPARTMENT_OTHER): Payer: BLUE CROSS/BLUE SHIELD

## 2017-07-12 DIAGNOSIS — Z95810 Presence of automatic (implantable) cardiac defibrillator: Secondary | ICD-10-CM | POA: Insufficient documentation

## 2017-07-12 DIAGNOSIS — I25810 Atherosclerosis of coronary artery bypass graft(s) without angina pectoris: Secondary | ICD-10-CM | POA: Diagnosis not present

## 2017-07-12 DIAGNOSIS — I2510 Atherosclerotic heart disease of native coronary artery without angina pectoris: Secondary | ICD-10-CM | POA: Insufficient documentation

## 2017-07-12 DIAGNOSIS — R0602 Shortness of breath: Secondary | ICD-10-CM | POA: Insufficient documentation

## 2017-07-12 DIAGNOSIS — E785 Hyperlipidemia, unspecified: Secondary | ICD-10-CM | POA: Insufficient documentation

## 2017-07-12 DIAGNOSIS — I5022 Chronic systolic (congestive) heart failure: Secondary | ICD-10-CM | POA: Insufficient documentation

## 2017-07-12 DIAGNOSIS — I255 Ischemic cardiomyopathy: Secondary | ICD-10-CM | POA: Insufficient documentation

## 2017-07-12 DIAGNOSIS — E119 Type 2 diabetes mellitus without complications: Secondary | ICD-10-CM | POA: Diagnosis not present

## 2017-07-12 DIAGNOSIS — R5383 Other fatigue: Secondary | ICD-10-CM | POA: Diagnosis not present

## 2017-07-12 DIAGNOSIS — I4510 Unspecified right bundle-branch block: Secondary | ICD-10-CM | POA: Diagnosis not present

## 2017-07-12 DIAGNOSIS — I4891 Unspecified atrial fibrillation: Secondary | ICD-10-CM | POA: Insufficient documentation

## 2017-07-12 DIAGNOSIS — I472 Ventricular tachycardia: Secondary | ICD-10-CM | POA: Diagnosis not present

## 2017-07-12 DIAGNOSIS — Z8249 Family history of ischemic heart disease and other diseases of the circulatory system: Secondary | ICD-10-CM | POA: Insufficient documentation

## 2017-07-12 DIAGNOSIS — I110 Hypertensive heart disease with heart failure: Secondary | ICD-10-CM | POA: Diagnosis not present

## 2017-07-12 DIAGNOSIS — I252 Old myocardial infarction: Secondary | ICD-10-CM | POA: Insufficient documentation

## 2017-07-12 DIAGNOSIS — R9439 Abnormal result of other cardiovascular function study: Secondary | ICD-10-CM | POA: Diagnosis not present

## 2017-07-12 DIAGNOSIS — Z87891 Personal history of nicotine dependence: Secondary | ICD-10-CM | POA: Diagnosis not present

## 2017-07-12 DIAGNOSIS — Z951 Presence of aortocoronary bypass graft: Secondary | ICD-10-CM | POA: Insufficient documentation

## 2017-07-12 LAB — LIPID PANEL
Chol/HDL Ratio: 6.6 ratio — ABNORMAL HIGH (ref 0.0–5.0)
Cholesterol, Total: 172 mg/dL (ref 100–199)
HDL: 26 mg/dL — AB (ref >39)
LDL Calculated: 88 mg/dL (ref 0–99)
TRIGLYCERIDES: 290 mg/dL — AB (ref 0–149)
VLDL Cholesterol Cal: 58 mg/dL — ABNORMAL HIGH (ref 5–40)

## 2017-07-12 LAB — BASIC METABOLIC PANEL
BUN/Creatinine Ratio: 16 (ref 9–20)
BUN: 12 mg/dL (ref 6–24)
CALCIUM: 9.5 mg/dL (ref 8.7–10.2)
CO2: 22 mmol/L (ref 20–29)
Chloride: 102 mmol/L (ref 96–106)
Creatinine, Ser: 0.76 mg/dL (ref 0.76–1.27)
GFR, EST AFRICAN AMERICAN: 129 mL/min/{1.73_m2} (ref >59)
GFR, EST NON AFRICAN AMERICAN: 112 mL/min/{1.73_m2} (ref >59)
Glucose: 101 mg/dL — ABNORMAL HIGH (ref 65–99)
Potassium: 4.5 mmol/L (ref 3.5–5.2)
Sodium: 140 mmol/L (ref 134–144)

## 2017-07-12 LAB — MYOCARDIAL PERFUSION IMAGING
CHL CUP MPHR: 177 {beats}/min
CHL CUP RESTING HR STRESS: 63 {beats}/min
CSEPHR: 93 %
Estimated workload: 13.4 METS
Exercise duration (min): 11 min
Exercise duration (sec): 16 s
LHR: 0.41
LVDIAVOL: 291 mL (ref 62–150)
LVSYSVOL: 208 mL
Peak HR: 166 {beats}/min
SDS: 1
SRS: 35
SSS: 36
TID: 0.98

## 2017-07-12 MED ORDER — TECHNETIUM TC 99M TETROFOSMIN IV KIT
9.5000 | PACK | Freq: Once | INTRAVENOUS | Status: AC | PRN
  Administered 2017-07-12: 9.5 via INTRAVENOUS
  Filled 2017-07-12: qty 10

## 2017-07-12 MED ORDER — PERFLUTREN LIPID MICROSPHERE
1.0000 mL | INTRAVENOUS | Status: AC | PRN
  Administered 2017-07-12: 2 mL via INTRAVENOUS

## 2017-07-12 MED ORDER — TECHNETIUM TC 99M TETROFOSMIN IV KIT
31.6000 | PACK | Freq: Once | INTRAVENOUS | Status: AC | PRN
  Administered 2017-07-12: 31.6 via INTRAVENOUS
  Filled 2017-07-12: qty 32

## 2017-07-13 ENCOUNTER — Encounter: Payer: Self-pay | Admitting: Physician Assistant

## 2017-07-16 ENCOUNTER — Encounter (HOSPITAL_BASED_OUTPATIENT_CLINIC_OR_DEPARTMENT_OTHER): Payer: BLUE CROSS/BLUE SHIELD

## 2017-07-19 ENCOUNTER — Ambulatory Visit: Payer: BLUE CROSS/BLUE SHIELD | Admitting: Family Medicine

## 2017-07-19 ENCOUNTER — Encounter: Payer: Self-pay | Admitting: Family Medicine

## 2017-07-19 ENCOUNTER — Ambulatory Visit: Payer: BLUE CROSS/BLUE SHIELD | Admitting: Physician Assistant

## 2017-07-19 ENCOUNTER — Encounter: Payer: Self-pay | Admitting: Physician Assistant

## 2017-07-19 VITALS — BP 138/82 | HR 70 | Ht 70.0 in | Wt 269.0 lb

## 2017-07-19 VITALS — BP 122/82 | HR 64 | Temp 98.2°F | Ht 70.0 in | Wt 270.0 lb

## 2017-07-19 DIAGNOSIS — I480 Paroxysmal atrial fibrillation: Secondary | ICD-10-CM

## 2017-07-19 DIAGNOSIS — E785 Hyperlipidemia, unspecified: Secondary | ICD-10-CM | POA: Diagnosis not present

## 2017-07-19 DIAGNOSIS — Q8901 Asplenia (congenital): Secondary | ICD-10-CM | POA: Insufficient documentation

## 2017-07-19 DIAGNOSIS — R05 Cough: Secondary | ICD-10-CM | POA: Diagnosis not present

## 2017-07-19 DIAGNOSIS — I25810 Atherosclerosis of coronary artery bypass graft(s) without angina pectoris: Secondary | ICD-10-CM | POA: Diagnosis not present

## 2017-07-19 DIAGNOSIS — I5022 Chronic systolic (congestive) heart failure: Secondary | ICD-10-CM | POA: Diagnosis not present

## 2017-07-19 DIAGNOSIS — R0609 Other forms of dyspnea: Secondary | ICD-10-CM | POA: Diagnosis not present

## 2017-07-19 DIAGNOSIS — Z23 Encounter for immunization: Secondary | ICD-10-CM | POA: Diagnosis not present

## 2017-07-19 DIAGNOSIS — Z95810 Presence of automatic (implantable) cardiac defibrillator: Secondary | ICD-10-CM | POA: Diagnosis not present

## 2017-07-19 DIAGNOSIS — R059 Cough, unspecified: Secondary | ICD-10-CM

## 2017-07-19 DIAGNOSIS — K219 Gastro-esophageal reflux disease without esophagitis: Secondary | ICD-10-CM

## 2017-07-19 LAB — CUP PACEART REMOTE DEVICE CHECK
Date Time Interrogation Session: 20190709132505
Implantable Lead Implant Date: 20171102
Implantable Lead Serial Number: 49533838
MDC IDC LEAD LOCATION: 753860
MDC IDC PG IMPLANT DT: 20171102
Pulse Gen Serial Number: 60940552

## 2017-07-19 MED ORDER — DOXYCYCLINE HYCLATE 100 MG PO TABS: 100.0000 mg | ORAL_TABLET | Freq: Two times a day (BID) | ORAL | 0 refills | Status: DC

## 2017-07-19 MED ORDER — FUROSEMIDE 20 MG PO TABS: 20.0000 mg | ORAL_TABLET | Freq: Every day | ORAL | 6 refills | Status: DC | PRN

## 2017-07-19 MED ORDER — SACUBITRIL-VALSARTAN 24-26 MG PO TABS: 1.0000 | ORAL_TABLET | Freq: Two times a day (BID) | ORAL | 3 refills | Status: DC

## 2017-07-19 MED ORDER — PANTOPRAZOLE SODIUM 40 MG PO TBEC: 40.0000 mg | DELAYED_RELEASE_TABLET | Freq: Every day | ORAL | 3 refills | Status: DC

## 2017-07-19 NOTE — Patient Instructions (Addendum)
Medication Instructions:  1. STOP LISINOPRIL AS OF TODAY  2. START ENTRESTO 24/26 MG 1 TABLET TWICE DAILY; YOU WILL START THE ENTRESTO 36 HOURS AFTER YOUR LAST DOSE OF LISINOPRIL. A NEW PRESCRIPTION HAS BEEN SENT IN AS WELL AS YOU HAVE BEEN GIVEN SAMPLES TODAY TO START OFF   3. CHANGE LASIX TO 20 MG DAILY ONLY AS NEEDED FOR EXCESS SWELLING OR INCREASED SHORTNESS OF BREATH  Labwork: 1. BMET TO BE DONE IN 1 WEEK ON 07/26/17; 7:30-4:30  2. FASTING LIPID AND LIVER PANEL TO BE DONE IN 3 MONTHS ON 10/18/17   Testing/Procedures: NONE ORDERED TODAY  Follow-Up: William Rubio, William Rubio ON 08/10/17 @ 8:45 AM   Any Other Special Instructions Will Be Listed Below (If Applicable).     If you need a refill on your cardiac medications before your next appointment, please call your pharmacy.

## 2017-07-19 NOTE — Assessment & Plan Note (Signed)
GERD is not well controlled Start protonix in place of zantac
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
## 2017-07-19 NOTE — Patient Instructions (Addendum)
Start doxycycline  Start protonix.  Take twice per day for two weeks, then daily Try using symbicort daily I will see you back in about 1 month You received a pneumococcal (prevnar) vaccine today.  I recommend a booster (pneumovax) for this in about 3-4 months.  You should continue to get this booster every 5 years.  Please check to see if you have had immunization for meningitis.    Long-Term Care After a Splenectomy A splenectomy is surgery to remove a diseased or injured spleen. The spleen is an organ that is located in the upper left part of the abdomen, just under the ribs. The spleen filters and cleans the blood. It also stores blood cells and destroys cells that are worn out. The spleen is also important for fighting disease. The spleen, along with other body systems and organs, plays an important role in the body's natural defense system (immune system). After your spleen is removed, you have a slightly greater chance of developing a serious, life-threatening infection. The following are some actions that you can take to prevent infection. How can I prevent infection? Your health care provider will recommend actions to help prevent infection. These may include:  Making sure that your immunizations are up-to-date, including: ? Pneumococcus. ? Seasonal flu (influenza). ? Hib (Haemophilus influenzae type b). ? Meningitis.  Making sure that vaccines are up-to-date for your family members.  Following good daily practices to prevent infection, such as: ? Washing your hands often, especially after preparing food, eating, changing diapers, and playing with children or animals. ? Disinfecting surfaces regularly. ? Avoiding people who have active illness or infections.  Taking precautions to avoid insect bites, such as: ? Wearing proper clothing that covers the entire body when you are in wooded or marshy areas. ? Changing clothing right away and checking for bites after you have been  outside. ? Using insect spray. ? Using insect netting. ? Staying indoors during hours when mosquitoes are most active.  Taking precautions to avoid dog bites. ? After a splenectomy, you may be at increased risk for rare infections that are associated with dog bites.  What do I need to do if I must travel? If you travel in the [place] take actions to avoid insect bites, especially in [place] and [place] coastal areas. Insects can carry many viruses, and you may be at an increased risk of becoming sick from these viruses. You should also take precautions if you travel abroad to places where malaria is common. In that case, follow these guidelines:  Contact your health care provider to get specific advice about the places that you will be visiting.  Get specific immunizations to guard against the disease risks in the country that you will be visiting.  Understand how to prevent infections, such as malaria, while you are abroad. These infections can pose serious risk. Precautions may include: ? Daily tablets to prevent malaria. ? Taking other precautions to prevent insect bites.  Bring broad-spectrum antibiotic medicines with you if they have been prescribed.  What other things do I need to remember to do?  Take over-the-counter and prescription medicines only as told by your health care provider.  If you were prescribed an antibiotic: ? Take it as told by your health care provider. ? Do not stop taking the antibiotic even if you start to feel better. ? Talk with your health care provider about the use of a probiotic supplement to prevent stomach upset.  Keep track of medicine refills so  you do not run out of medicine.  Inform your close contacts of your condition. Consider wearing a medical alert bracelet or carrying an ID card.  Keep all follow-up visits as told by your health care provider. This is important. When should I seek medical care? Call your health care provider if:  You  have a fever.  You have signs of infection that continue after taking an antibiotic. Signs may include chills and feeling unwell.  You are considering travel abroad.  You are bitten by a tick or a dog.  When should I seek immediate medical care? Call 911 or go to the emergency department if:  You have chest pain along with: ? Shortness of breath. ? Pain in the back, neck, or jaw.  You have pain or swelling in the leg.  You develop a sudden headache and dizziness.  This information is not intended to replace advice given to you by your health care provider. Make sure you discuss any questions you have with your health care provider. Document Released: 06/17/2009 Document Revised: 06/02/2015 Document Reviewed: 09/10/2014 Elsevier Interactive Patient Education  2018 [employer].

## 2017-07-19 NOTE — Assessment & Plan Note (Signed)
Prevnar given today Will need pneumovax in a few months I asked that he check to see if he had meningitis vaccine.

## 2017-07-19 NOTE — Assessment & Plan Note (Signed)
I think that his respiratory symptoms are multifactorial.   Unfortunately it appears that his EF has worsened and he has f/u with cardiology to discuss this and possibly starting entresto.   He has COPD and has not been compliant with symbicort, instructed to use this.  Given recurrent symptoms and associated fevers, will treat with doxycycline as well, especially with history of asplenia.  Prevnar vaccine given today.  Will need pneumovax in the future as well.

## 2017-07-26 ENCOUNTER — Other Ambulatory Visit: Payer: BLUE CROSS/BLUE SHIELD

## 2017-07-26 DIAGNOSIS — I5022 Chronic systolic (congestive) heart failure: Secondary | ICD-10-CM

## 2017-07-26 LAB — BASIC METABOLIC PANEL
BUN / CREAT RATIO: 18 (ref 9–20)
BUN: 14 mg/dL (ref 6–24)
CHLORIDE: 99 mmol/L (ref 96–106)
CO2: 21 mmol/L (ref 20–29)
Calcium: 9.5 mg/dL (ref 8.7–10.2)
Creatinine, Ser: 0.77 mg/dL (ref 0.76–1.27)
GFR calc Af Amer: 128 mL/min/{1.73_m2} (ref >59)
GFR calc non Af Amer: 111 mL/min/{1.73_m2} (ref >59)
GLUCOSE: 126 mg/dL — AB (ref 65–99)
POTASSIUM: 4.9 mmol/L (ref 3.5–5.2)
SODIUM: 136 mmol/L (ref 134–144)

## 2017-07-27 ENCOUNTER — Telehealth: Payer: Self-pay

## 2017-07-27 NOTE — Telephone Encounter (Signed)
Notes recorded by Sigurd Sosapp, Mickayla Trouten, RN on 07/27/2017 at 11:56 AM EDT Informed patient by VM of lab results and recommendations. Told him to call, if questions.

## 2017-07-27 NOTE — Telephone Encounter (Signed)
-----   Message from Beatrice LecherScott T Weaver, PA-C sent at 07/27/2017  6:48 AM EDT ----- Kidney function (Creatinine) and potassium are normal.  Continue current medications and follow up as planned.  Tereso NewcomerScott Weaver, PA-C    07/27/2017 6:47 AM

## 2017-08-01 ENCOUNTER — Telehealth: Payer: Self-pay

## 2017-08-01 NOTE — Telephone Encounter (Signed)
Opened in error

## 2017-08-01 NOTE — Telephone Encounter (Signed)
For VF, needs amiodarone 400 mg BID for 2 weeks, followed by 400 mg daily for 2 weeks then 200 mg daily. Needs hepatic panel, TSH, LFTs.

## 2017-08-01 NOTE — Telephone Encounter (Signed)
Spoke with pt regarding Dr. Elberta Fortisamnitz recommendations pt stated that he wanted to talk with Dr. Elberta Fortisamnitz about starting this medications since he was on Entresto/Lisinopril/Toprol. Attempted to explain to pt that the Amiodarone was to treat the type of arrhyhtmia he had yesterday, pt exclaimed that he felt like he was already taking enough medications and that he had enough labs drawn, pt transferred to scheduler to make apt with Dr. Elberta Fortisamnitz regarding medication and lab questions.

## 2017-08-01 NOTE — Telephone Encounter (Signed)
Spoke with pt regarding VF episode that was successfully treated with shock x 1. Pt stated that he was playing tennis and got really dizzy sat down and black out pt reported that he had taken his Toprol-XL 100mg  yesterday and Enresto. Informed pt of driving restrictions x 6 months, pt voiced understanding. Informed pt that I would review with Dr. Elberta Fortisamnitz and call back with recommendations pt voiced understanding.

## 2017-08-02 ENCOUNTER — Ambulatory Visit (HOSPITAL_BASED_OUTPATIENT_CLINIC_OR_DEPARTMENT_OTHER): Payer: BLUE CROSS/BLUE SHIELD | Attending: Physician Assistant | Admitting: Cardiovascular Disease

## 2017-08-02 ENCOUNTER — Other Ambulatory Visit: Payer: Self-pay | Admitting: Cardiology

## 2017-08-02 VITALS — Ht 70.0 in | Wt 260.0 lb

## 2017-08-02 DIAGNOSIS — I10 Essential (primary) hypertension: Secondary | ICD-10-CM | POA: Insufficient documentation

## 2017-08-02 DIAGNOSIS — R0683 Snoring: Secondary | ICD-10-CM | POA: Diagnosis not present

## 2017-08-02 DIAGNOSIS — J449 Chronic obstructive pulmonary disease, unspecified: Secondary | ICD-10-CM | POA: Diagnosis not present

## 2017-08-02 DIAGNOSIS — R5383 Other fatigue: Secondary | ICD-10-CM | POA: Insufficient documentation

## 2017-08-02 DIAGNOSIS — G478 Other sleep disorders: Secondary | ICD-10-CM

## 2017-08-02 DIAGNOSIS — G4730 Sleep apnea, unspecified: Secondary | ICD-10-CM

## 2017-08-10 ENCOUNTER — Telehealth: Payer: Self-pay | Admitting: Physician Assistant

## 2017-08-10 ENCOUNTER — Encounter: Payer: Self-pay | Admitting: Physician Assistant

## 2017-08-10 ENCOUNTER — Ambulatory Visit: Payer: BLUE CROSS/BLUE SHIELD | Admitting: Physician Assistant

## 2017-08-10 VITALS — BP 130/88 | HR 65 | Ht 70.0 in | Wt 275.0 lb

## 2017-08-10 DIAGNOSIS — I5022 Chronic systolic (congestive) heart failure: Secondary | ICD-10-CM | POA: Diagnosis not present

## 2017-08-10 DIAGNOSIS — I2510 Atherosclerotic heart disease of native coronary artery without angina pectoris: Secondary | ICD-10-CM | POA: Diagnosis not present

## 2017-08-10 DIAGNOSIS — I4901 Ventricular fibrillation: Secondary | ICD-10-CM

## 2017-08-10 DIAGNOSIS — Z95810 Presence of automatic (implantable) cardiac defibrillator: Secondary | ICD-10-CM

## 2017-08-10 DIAGNOSIS — I10 Essential (primary) hypertension: Secondary | ICD-10-CM

## 2017-08-10 DIAGNOSIS — E785 Hyperlipidemia, unspecified: Secondary | ICD-10-CM

## 2017-08-10 DIAGNOSIS — I480 Paroxysmal atrial fibrillation: Secondary | ICD-10-CM

## 2017-08-10 LAB — BASIC METABOLIC PANEL
BUN / CREAT RATIO: 18 (ref 9–20)
BUN: 13 mg/dL (ref 6–24)
CO2: 23 mmol/L (ref 20–29)
Calcium: 9.4 mg/dL (ref 8.7–10.2)
Chloride: 101 mmol/L (ref 96–106)
Creatinine, Ser: 0.72 mg/dL — ABNORMAL LOW (ref 0.76–1.27)
GFR calc Af Amer: 132 mL/min/{1.73_m2} (ref >59)
GFR calc non Af Amer: 114 mL/min/{1.73_m2} (ref >59)
GLUCOSE: 77 mg/dL (ref 65–99)
Potassium: 4.7 mmol/L (ref 3.5–5.2)
SODIUM: 139 mmol/L (ref 134–144)

## 2017-08-10 LAB — MAGNESIUM: MAGNESIUM: 2.1 mg/dL (ref 1.6–2.3)

## 2017-08-10 MED ORDER — SACUBITRIL-VALSARTAN 49-51 MG PO TABS: 1.0000 | ORAL_TABLET | Freq: Two times a day (BID) | ORAL | 3 refills | Status: DC

## 2017-08-10 NOTE — Telephone Encounter (Signed)
I called the pt and advised pt per Dr. Excell Seltzerooper and Tereso NewcomerScott Weaver, PA that we can hold off on the cath for now. Pt aware to let us know if his symptoms, status change. Pt thanked me for the call.
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
## 2017-08-10 NOTE — Telephone Encounter (Signed)
Please tell patient I spoke with Dr. Excell Seltzerooper. We can hold off on cath for now unless his symptoms, status changes. Tereso NewcomerScott Shamel Galyean, PA-C    08/10/2017 1:29 PM

## 2017-08-10 NOTE — Patient Instructions (Signed)
Medication Instructions:  1. INCREASE ENTRESTO TO 49/51 MG 1 TABLET TWICE DAILY; RX HAS BEEN SENT IN. YOU HAVE BEEN GIVEN A FREE 30 DAY CARD  Labwork: 1. TODAY BMET, MAGNESIUM   2. BMET TO BE DONE IN 1 WEEK  Testing/Procedures: NONE ORDERED TODAY  Follow-Up: KEEP YOUR APPT TOMORROW WITH DR. Elberta FortisAMNITZ ; YOU CAN DISCUSS AT THAT TIME ABOUT NO DRIVING   Any Other Special Instructions Will Be Listed Below (If Applicable).     If you need a refill on your cardiac medications before your next appointment, please call your pharmacy.

## 2017-08-11 ENCOUNTER — Encounter: Payer: Self-pay | Admitting: Cardiology

## 2017-08-11 ENCOUNTER — Ambulatory Visit: Payer: BLUE CROSS/BLUE SHIELD | Admitting: Cardiology

## 2017-08-11 VITALS — BP 120/88 | HR 59 | Ht 70.0 in | Wt 269.6 lb

## 2017-08-11 DIAGNOSIS — I255 Ischemic cardiomyopathy: Secondary | ICD-10-CM

## 2017-08-11 DIAGNOSIS — I4901 Ventricular fibrillation: Secondary | ICD-10-CM | POA: Diagnosis not present

## 2017-08-11 DIAGNOSIS — Z95810 Presence of automatic (implantable) cardiac defibrillator: Secondary | ICD-10-CM | POA: Diagnosis not present

## 2017-08-11 DIAGNOSIS — I480 Paroxysmal atrial fibrillation: Secondary | ICD-10-CM | POA: Diagnosis not present

## 2017-08-11 DIAGNOSIS — I25810 Atherosclerosis of coronary artery bypass graft(s) without angina pectoris: Secondary | ICD-10-CM

## 2017-08-11 LAB — CUP PACEART INCLINIC DEVICE CHECK
Battery Voltage: 3.09 V
Brady Statistic RV Percent Paced: 99 %
Date Time Interrogation Session: 20190801101136
HIGH POWER IMPEDANCE MEASURED VALUE: 88 Ohm
Implantable Lead Location: 753860
Implantable Lead Model: 414005
Implantable Pulse Generator Implant Date: 20171102
Lead Channel Impedance Value: 477 Ohm
Lead Channel Sensing Intrinsic Amplitude: 8.8 mV
Lead Channel Setting Pacing Amplitude: 2.5 V
Lead Channel Setting Sensing Sensitivity: 0.8 mV
MDC IDC LEAD IMPLANT DT: 20171102
MDC IDC LEAD SERIAL: 49533838
MDC IDC MSMT LEADCHNL RV PACING THRESHOLD AMPLITUDE: 0.6 V
MDC IDC MSMT LEADCHNL RV PACING THRESHOLD PULSEWIDTH: 0.4 ms
MDC IDC MSMT LEADCHNL RV SENSING INTR AMPL: 24.2 mV
MDC IDC PG SERIAL: 60940552
MDC IDC SET LEADCHNL RV PACING PULSEWIDTH: 0.4 ms

## 2017-08-11 MED ORDER — MEXILETINE HCL 200 MG PO CAPS: 200.0000 mg | ORAL_CAPSULE | Freq: Two times a day (BID) | ORAL | 3 refills | Status: DC

## 2017-08-11 NOTE — Patient Instructions (Addendum)
Medication Instructions:  Your physician has recommended you make the following change in your medication:  1. START Mexiletine 200 mg twice daily  * If you need a refill on your cardiac medications before your next appointment, please call your pharmacy.   Labwork: None ordered  Testing/Procedures: None ordered  Follow-Up: Remote monitoring is used to monitor your Pacemaker of ICD from home. This monitoring reduces the number of office visits required to check your device to one time per year. It allows [place] to keep an eye on the functioning of your device to ensure it is working properly. You are scheduled for a device check from home on 09/28/2017. You may send your transmission at any time that day. If you have a wireless device, the transmission will be sent automatically. After your physician reviews your transmission, you will receive a postcard with your next transmission date.  Your physician recommends that you schedule a follow-up appointment in: 3 months with Dr. Elberta Fortis.  *Please note that any paperwork needing to be filled out by the provider will need to be addressed at the front desk prior to seeing the provider. Please note that any FMLA, disability or other documents regarding health condition is subject to a $25.00 charge that must be received prior to completion of paperwork in the form of a money order or check.  Thank you for choosing [facility]!!   Dory Horn, RN [phone]  Any Other Special Instructions Will Be Listed Below (If Applicable).  Mexiletine capsules What is this medicine? MEXILETINE (mex IL e teen) is an antiarrhythmic agent. This medicine is used to treat irregular heart rhythm and can slow rapid heartbeats. It can help your heart to return to and maintain a normal rhythm. Because of the side effects caused by this medicine, it is usually used for heartbeat problems that may be life-threatening. This medicine may be used for other purposes;  ask your health care provider or pharmacist if you have questions. COMMON BRAND NAME(S): Mexitil What should I tell my health care provider before I take this medicine? They need to know if you have any of these conditions: -liver disease -other heart problems -previous heart attack -an unusual or allergic reaction to mexiletine, other medicines, foods, dyes, or preservatives -pregnant or trying to get pregnant -breast-feeding How should I use this medicine? Take this medicine by mouth with a glass of water. Follow the directions on the prescription label. It is recommended that you take this medicine with food or an antacid. Take your doses at regular intervals. Do not take your medicine more often than directed. Do not stop taking except on the advice of your doctor or health care professional. Talk to your pediatrician regarding the use of this medicine in children. Special care may be needed. Overdosage: If you think you have taken too much of this medicine contact a poison control center or emergency room at once. NOTE: This medicine is only for you. Do not share this medicine with others. What if I miss a dose? If you miss a dose, take it as soon as you can. If it is almost time for your next dose, take only that dose. Do not take double or extra doses. What may interact with this medicine? Do not take this medicine with any of the following medications: -dofetilide This medicine may also interact with the following medications: -caffeine -cimetidine -medicines for depression, anxiety, or psychotic disturbances -medicines to control heart rhythm -phenobarbital -phenytoin -rifampin -theophylline This list may  not describe all possible interactions. Give your health care provider a list of all the medicines, herbs, non-prescription drugs, or dietary supplements you use. Also tell them if you smoke, drink alcohol, or use illegal drugs. Some items may interact with your medicine. What  should I watch for while using this medicine? Your condition will be monitored closely when you first begin therapy. Often, this drug is first started in a hospital or other monitored health care setting. Once you are on maintenance therapy, visit your doctor or health care professional for regular checks on your progress. Because your condition and use of this medicine carry some risk, it is a good idea to carry an identification card, necklace or bracelet with details of your condition, medications, and doctor or health care professional. Bonita QuinYou may get drowsy or dizzy. Do not drive, use machinery, or do anything that needs mental alertness until you know how this medicine affects you. Do not stand or sit up quickly, especially if you are an older patient. This reduces the risk of dizzy or fainting spells. Alcohol can make you more dizzy, increase flushing and rapid heartbeats. Avoid alcoholic drinks. What side effects may I notice from receiving this medicine? Side effects that you should report to your doctor or health care professional as soon as possible: -allergic reactions like skin rash, itching or hives, swelling of the face, lips, or tongue -breathing problems -chest pain, continued irregular heartbeats -redness, blistering, peeling or loosening of the skin, including inside the mouth -seizures -skin rash -trembling, shaking -unusual bleeding or bruising -unusually weak or tired Side effects that usually do not require medical attention (report to your doctor or health care professional if they continue or are bothersome): -blurred vision -difficulty walking -heartburn -nausea, vomiting -nervousness -numbness, or tingling in the fingers or toes This list may not describe all possible side effects. Call your doctor for medical advice about side effects. You may report side effects to FDA at 1-800-FDA-1088. Where should I keep my medicine? Keep out of reach of children. Store at room  temperature between 15 and 30 degrees C (59 and 86 degrees F). Throw away any unused medicine after the expiration date. NOTE: This sheet is a summary. It may not cover all possible information. If you have questions about this medicine, talk to your doctor, pharmacist, or health care provider.  2018 Elsevier/Gold Standard (2007-07-17 13:59:49)

## 2017-08-14 ENCOUNTER — Encounter (HOSPITAL_BASED_OUTPATIENT_CLINIC_OR_DEPARTMENT_OTHER): Payer: Self-pay | Admitting: Cardiovascular Disease

## 2017-08-14 NOTE — Procedures (Signed)
Patient Name: William Rubio, [name] Study Date: 08/02/2017 Gender: Male D.O.B: September 10, 1975 Age (years): [age] Referring Provider: Tereso NewcomerScott Weaver Height (inches): 70 Interpreting Physician: Nicki Guadalajarahomas Yarielis Funaro MD, ABSM Weight (lbs): 260 RPSGT: Ulyess MortSpruill, Vicki BMI: 37 MRN: [id number] Neck Size: 18.50  CLINICAL INFORMATION Sleep Study Type: NPSG  Indication for sleep study: COPD, Fatigue, Hypertension, Snoring; PAF, ICD, cardiomyopathy.  Epworth Sleepiness Score: 7  SLEEP STUDY TECHNIQUE As per the AASM Manual for the Scoring of Sleep and Associated Events v2.3 (April 2016) with a hypopnea requiring 4% desaturations.  The channels recorded and monitored were frontal, central and occipital EEG, electrooculogram (EOG), submentalis EMG (chin), nasal and oral airflow, thoracic and abdominal wall motion, anterior tibialis EMG, snore microphone, electrocardiogram, and pulse oximetry.  MEDICATIONS     aspirin EC 81 MG tablet             budesonide-formoterol (SYMBICORT) 160-4.5 MCG/ACT inhaler         doxycycline (VIBRA-TABS) 100 MG tablet         furosemide (LASIX) 20 MG tablet         metoprolol succinate (TOPROL-XL) 100 MG 24 hr tablet         mexiletine (MEXITIL) 200 MG capsule         pantoprazole (PROTONIX) 40 MG tablet         pravastatin (PRAVACHOL) 80 MG tablet         ranitidine (ZANTAC) 150 MG capsule         sacubitril-valsartan (ENTRESTO) 49-51 MG      Medications self-administered by patient taken the night of the study : BENADRYL  SLEEP ARCHITECTURE The study was initiated at 10:06:05 PM and ended at 4:33:41 AM.  Sleep onset time was 38.1 minutes and the sleep efficiency was 73.3%%. The total sleep time was 284 minutes. Wake after sleep onset (WASO) was65.1 minutes.  Stage REM latency was 176.5 minutes.  The patient spent 11.8%% of the night in stage N1 sleep, 84.0%% in stage N2 sleep, 0.0%% in stage N3 and 4.2% in REM.  Alpha intrusion was absent.  Supine sleep was  56.86%.  RESPIRATORY PARAMETERS The overall apnea/hypopnea index (AHI) was 4.2 per hour. The respiratory disturbance index (RDI) was 13.5/h. There were 4 total apneas, including 0 obstructive, 4 central and 0 mixed apneas. There were 16 hypopneas and 44 RERAs.  The AHI during Stage REM sleep was 5.0 per hour; REM RDI 10.0/h.  AHI while supine was 4.5 per hour.  The mean oxygen saturation was 93.0%. The minimum SpO2 during sleep was 86.0%.  moderate snoring was noted during this study.  CARDIAC DATA The 2 lead EKG demonstrated sinus rhythm. The mean heart rate was 57.7 beats per minute. Other EKG findings include: PVCs.  LEG MOVEMENT DATA The total PLMS were 0 with a resulting PLMS index of 0.0. Associated arousal with leg movement index was 2.3 .  IMPRESSIONS - Increased upper airway resistance syndrome (UARS) with borderline obstructive sleep apnea (AHI 4.2/h; RDI 13.5/h) and mild sleep apnea during REM sleep. - No significant central sleep occurred during this study (CAI = 0.8/h). - Mild oxygen desaturation to a nadir of 86%. - The patient snored with moderate snoring volume. - EKG findings include PVCs. - Clinically significant periodic limb movements did not occur during sleep. No significant associated arousals.  DIAGNOSIS - Sleep Apnea, unspecified type G47.30. - Nocturnal Hypoxemia (327.26 [G47.36 ICD-10])  RECOMMENDATIONS - Borderline OSA with mild sleep apnea during REM sleep. - Efforts should be  made to optimize nasal and oropharyngeal patency. - Consider initial alternatives to CPAP therapy, but if patient has progressive symptoms CPAP therapy can be beneficial. - Avoid alcohol, sedatives and other CNS depressants that may worsen sleep apnea and disrupt normal sleep architecture. - Sleep hygiene should be reviewed to assess factors that may improve sleep quality. - Weight management BMI 37) and regular exercise should be initiated.  [Electronically signed] 08/14/2017  10:57 AM  Nicki Guadalajara MD, [facility], ABSM Diplomate, American Board of Sleep Medicine   NPI: [id number] [facility] PH: [phone]   FX: [phone] ACCREDITED BY THE AMERICAN ACADEMY OF SLEEP MEDICINE

## 2017-08-17 ENCOUNTER — Other Ambulatory Visit: Payer: BLUE CROSS/BLUE SHIELD

## 2017-08-17 ENCOUNTER — Ambulatory Visit: Payer: BLUE CROSS/BLUE SHIELD | Admitting: Family Medicine

## 2017-08-19 NOTE — Progress Notes (Signed)
Sleep study with borderline obstructive sleep apnea.   At this point, CPAP is not recommended.  Recommend working on weight loss to help with sleep architecture. Also, would avoid alcohol and sedatives prior to bedtime. If snoring persists along with witnessed apnea, daytime sleepiness, will need to refer to Dr. Tresa EndoKelly or Dr. Mayford Knifeurner for further evaluation.  Patient should call if family notes continued snoring or apnea or if he feels like fatigue persists. Tereso NewcomerScott Dionysios Massman, PA-C    08/19/2017 4:27 PM

## 2017-08-22 ENCOUNTER — Telehealth: Payer: Self-pay | Admitting: *Deleted

## 2017-08-22 NOTE — Progress Notes (Signed)
Left message to go over sleep study results and recommendations.  

## 2017-08-22 NOTE — Telephone Encounter (Signed)
Pt has been informed of sleep results and recommendations. Pt states he has a question for our office in regards to some medication changes and he is not sure which he should do first. Pt states to start with on 07/19/17  Tereso NewcomerScott Weaver, PA d/c Lisinopril and started him on Entresto 24/26 mg BID. Pt states to me since that change he has experienced vertigo and some BP issues with diastolic dropping down into the 60's. Pt denies any syncope. Pt states he then saw Bing NeighborsScott W. PA again on 7/31 with Entresto to be increased to 49/51 mg BID. Pt then saw Dr. Elberta Fortisamnitz on 8/1 and was started on Mexiletine 200 mg BID. Pt states he has not increased the Entresto as instructed on 7/31 nor has he started Mexiletine as instructed by Dr. Elberta Fortisamnitz. Pt states he is not sure he should be making both of these changes at the same time since he has been having vertigo since [facility] the [facility]. I advised pt Tereso NewcomerScott Weaver, PA is out of the office toady however Dr. Elberta Fortisamnitz is here. Advised I will d/w Dr. Elberta Fortisamnitz for further recommendations and call him back late today. Pt thanked me for the call.

## 2017-08-22 NOTE — Telephone Encounter (Signed)
-----   Message from Scott T Weaver, PA-C sent at 08/19/2017  4:30 PM EDT -----   ----- Message ----- From: Weaver, Scott T, PA-C Sent: 08/14/2017  11:01 AM EDT To: Scott T Weaver, PA-C, Cody Matthews, DO  

## 2017-08-22 NOTE — Telephone Encounter (Signed)
Left message to go over sleep study results and recommendations.

## 2017-08-22 NOTE — Progress Notes (Signed)
Pt has been informed of sleep results and recommendations. Pt states he has a question for our office in regards to some medication changes and he is not sure which he should do first. Pt states to start with on 07/19/17  Scott Weaver, PA d/c Lisinopril and started him on Entresto 24/26 mg BID. Pt states to me since that change he has experienced vertigo and some BP issues with diastolic dropping down into the 60's. Pt denies any syncope. Pt states he then saw Scott W. PA again on 7/31 with Entresto to be increased to 49/51 mg BID. Pt then saw Dr. Camnitz on 8/1 and was started on Mexiletine 200 mg BID. Pt states he has not increased the Entresto as instructed on 7/31 nor has he started Mexiletine as instructed by Dr. Camnitz. Pt states he is not sure he should be making both of these changes at the same time since he has been having vertigo since starting the Entresto. I advised pt Scott Weaver, PA is out of the office toady however Dr. Camnitz is here. Advised I will d/w Dr. Camnitz for further recommendations and call him back late today. Pt thanked me for the call.  

## 2017-08-22 NOTE — Telephone Encounter (Signed)
I recommend he remain on Entresto 24/26 bid.  He should try to take Lasix only as needed.  He should only take Lasix if he has a > 3 lb weight gain in 24 hours.    I would defer to Dr. Elberta Fortisamnitz regarding the Mexilitine.    Tereso NewcomerScott Weaver, PA-C    08/22/2017 2:29 PM

## 2017-08-22 NOTE — Telephone Encounter (Signed)
-----   Message from Beatrice LecherScott T Weaver, [place]PA-C sent at 08/19/2017  4:30 PM EDT -----   ----- Message ----- From: Beatrice LecherWeaver, Scott T, PA-C Sent: 08/14/2017  11:01 AM EDT To: Beatrice LecherScott T Weaver, PA-C, Everrett Coombeody Matthews, DO

## 2017-08-23 MED ORDER — SACUBITRIL-VALSARTAN 24-26 MG PO TABS: 1.0000 | ORAL_TABLET | Freq: Two times a day (BID) | ORAL | 3 refills | Status: DC

## 2017-08-23 NOTE — Telephone Encounter (Signed)
Left message to d/w the pt the recommendations per Tereso NewcomerScott Weaver, PA [facility] as well as Per Dr. Elberta Fortisamnitz pt needs to start the Mexlitine due to he recently was in v-fib.

## 2017-08-23 NOTE — Telephone Encounter (Signed)
She had ventricular fibrillation.  He does need an antiarrhythmic.  I feel that mexiletine is likely the best initial medication for him.

## 2017-08-23 NOTE — Telephone Encounter (Signed)
S/w pt and he has been advised of recommendations per Tereso NewcomerScott Weaver, PA to not increase the [facility]o and that we will continue on the 24/26 mg BID dosing, as well as advised of recommendations per Dr. Elberta Fortisamnitz that pt needs to start the Mexiletine today. Explained to the pt the importance of the Mexiletine as it is an antiarrythmic to help keep his heart in normal rhythm. Reminded pt he was just recently in ventricular fibrillation and that this could potentially be a dangerous rhythm. I again stated he needs to start the mexiletine today as he did not start it on 08/11/17 as prescribed by Dr. Elberta Fortisamnitz. Pt gave verbal read back to me x 1 and thanked me for the call. Per Tereso NewcomerScott Weaver, PA we can cancel the BMET for 8/22 as we are not increasing the Entresto. I will cancel the lab appt. I will forward this note to both Dr. Elberta Fortisamnitz and Tereso NewcomerScott Weaver, PA-C.

## 2017-08-23 NOTE — Telephone Encounter (Signed)
Agree Tereso NewcomerScott Royalty Domagala, PA-C    08/23/2017 4:58 PM

## 2017-08-25 ENCOUNTER — Encounter: Payer: Self-pay | Admitting: *Deleted

## 2017-08-25 NOTE — Telephone Encounter (Signed)
This encounter was created in error - please disregard.

## 2017-08-31 ENCOUNTER — Ambulatory Visit: Payer: BLUE CROSS/BLUE SHIELD | Admitting: Family Medicine

## 2017-09-01 ENCOUNTER — Other Ambulatory Visit: Payer: BLUE CROSS/BLUE SHIELD

## 2017-09-07 ENCOUNTER — Other Ambulatory Visit: Payer: Self-pay | Admitting: Physician Assistant

## 2017-09-08 ENCOUNTER — Other Ambulatory Visit: Payer: Self-pay | Admitting: Cardiology

## 2017-09-09 ENCOUNTER — Telehealth: Payer: Self-pay | Admitting: Physician Assistant

## 2017-09-09 NOTE — Telephone Encounter (Signed)
Pt states he was left a message from [facility], [place] to call back and confirm his Metoprolol dose. I s/w pt and this has been confirmed and Rx has been sent in. Pt thanked me for the call back.

## 2017-09-09 NOTE — Telephone Encounter (Signed)
Follow Up: ° ° ° °Returning your call from this morning. °

## 2017-09-21 ENCOUNTER — Ambulatory Visit: Payer: BLUE CROSS/BLUE SHIELD | Admitting: Physician Assistant

## 2017-09-27 ENCOUNTER — Encounter: Payer: Self-pay | Admitting: Family Medicine

## 2017-09-27 ENCOUNTER — Ambulatory Visit: Payer: BLUE CROSS/BLUE SHIELD | Admitting: Family Medicine

## 2017-09-27 ENCOUNTER — Ambulatory Visit (INDEPENDENT_AMBULATORY_CARE_PROVIDER_SITE_OTHER): Payer: BLUE CROSS/BLUE SHIELD

## 2017-09-27 VITALS — BP 112/74 | HR 72 | Temp 98.2°F | Ht 70.0 in | Wt 271.6 lb

## 2017-09-27 DIAGNOSIS — K219 Gastro-esophageal reflux disease without esophagitis: Secondary | ICD-10-CM

## 2017-09-27 DIAGNOSIS — R0600 Dyspnea, unspecified: Secondary | ICD-10-CM

## 2017-09-27 DIAGNOSIS — R0609 Other forms of dyspnea: Secondary | ICD-10-CM

## 2017-09-27 MED ORDER — IPRATROPIUM BROMIDE 0.03 % NA SOLN: 2.0000 | Freq: Two times a day (BID) | NASAL | 3 refills | Status: DC

## 2017-09-27 NOTE — Assessment & Plan Note (Signed)
Continued dyspnea, lungs clear today. CXR ordered given continued symptoms.  Continue symbicort bid Will add atrovent nasal spray for chronic nasal drainage.

## 2017-09-27 NOTE — Assessment & Plan Note (Signed)
Improved with protonix, continue.  
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
## 2017-09-27 NOTE — Patient Instructions (Signed)
Try atrovent nasal spray for nasal drainage Continue current medications I would strongly recommend a flu vaccine We'll call you with chest xray results

## 2017-09-28 ENCOUNTER — Ambulatory Visit (INDEPENDENT_AMBULATORY_CARE_PROVIDER_SITE_OTHER): Payer: BLUE CROSS/BLUE SHIELD | Admitting: *Deleted

## 2017-09-28 DIAGNOSIS — I255 Ischemic cardiomyopathy: Secondary | ICD-10-CM | POA: Diagnosis not present

## 2017-09-28 DIAGNOSIS — I5022 Chronic systolic (congestive) heart failure: Secondary | ICD-10-CM

## 2017-09-28 NOTE — Progress Notes (Signed)
Remote ICD transmission.   

## 2017-09-29 ENCOUNTER — Ambulatory Visit: Payer: Self-pay

## 2017-09-29 ENCOUNTER — Telehealth: Payer: Self-pay | Admitting: Family Medicine

## 2017-09-29 NOTE — Telephone Encounter (Signed)
Provided Imaging results. Per Dr. Everrett Coombeody Matthews.  On 09/29/17.  Patient voices understanding.

## 2017-09-29 NOTE — Progress Notes (Signed)
Xray shows some mild bronchitic changes consistent with mild COPD, otherwise normal.

## 2017-09-29 NOTE — Telephone Encounter (Signed)
Patient came in about results of chest xray. Patient is wanting to know if he needs any antibiotics. Please contact patient.

## 2017-09-29 NOTE — Telephone Encounter (Signed)
Spoke with patient regarding lab results. Patient understood and had no further questions or concerns.

## 2017-10-03 ENCOUNTER — Telehealth: Payer: Self-pay | Admitting: Internal Medicine

## 2017-10-03 NOTE — Telephone Encounter (Signed)
Yes, I am fine with this.  Josephine IgoBradley L Icard, DO [facility] 10/03/2017 5:12 PM

## 2017-10-03 NOTE — Telephone Encounter (Signed)
Fine with me

## 2017-10-03 NOTE — Telephone Encounter (Signed)
Dr. Tonia BroomsIcard are you okay to establish care with this patient? Due  To him wanting to switch providers.

## 2017-10-03 NOTE — Telephone Encounter (Signed)
LMOM  To call back

## 2017-10-03 NOTE — Telephone Encounter (Signed)
Spoke with patient. He stated that he would like to switch providers from [facility] to someone else in the office. He does not have a preference. Advised him of the office protocol, he verbalized understanding.   MW, please advise if you are ok with him switching to another provider. Thanks!

## 2017-10-04 NOTE — Telephone Encounter (Signed)
Patient is aware and appt has been made nothing further needed at this time.

## 2017-10-06 ENCOUNTER — Ambulatory Visit: Payer: BLUE CROSS/BLUE SHIELD | Admitting: Pulmonary Disease

## 2017-10-06 ENCOUNTER — Encounter: Payer: Self-pay | Admitting: Pulmonary Disease

## 2017-10-06 ENCOUNTER — Other Ambulatory Visit (INDEPENDENT_AMBULATORY_CARE_PROVIDER_SITE_OTHER): Payer: BLUE CROSS/BLUE SHIELD

## 2017-10-06 VITALS — BP 140/82 | HR 68 | Ht 70.0 in | Wt 271.0 lb

## 2017-10-06 DIAGNOSIS — J449 Chronic obstructive pulmonary disease, unspecified: Secondary | ICD-10-CM | POA: Diagnosis not present

## 2017-10-06 DIAGNOSIS — I5022 Chronic systolic (congestive) heart failure: Secondary | ICD-10-CM

## 2017-10-06 DIAGNOSIS — I480 Paroxysmal atrial fibrillation: Secondary | ICD-10-CM

## 2017-10-06 DIAGNOSIS — R0609 Other forms of dyspnea: Secondary | ICD-10-CM

## 2017-10-06 DIAGNOSIS — K219 Gastro-esophageal reflux disease without esophagitis: Secondary | ICD-10-CM

## 2017-10-06 LAB — CBC WITH DIFFERENTIAL/PLATELET
BASOS ABS: 0.1 10*3/uL (ref 0.0–0.1)
Basophils Relative: 0.9 % (ref 0.0–3.0)
EOS ABS: 0.3 10*3/uL (ref 0.0–0.7)
Eosinophils Relative: 2.8 % (ref 0.0–5.0)
HEMATOCRIT: 44.2 % (ref 39.0–52.0)
Hemoglobin: 15.2 g/dL (ref 13.0–17.0)
LYMPHS PCT: 40.9 % (ref 12.0–46.0)
Lymphs Abs: 4.5 10*3/uL — ABNORMAL HIGH (ref 0.7–4.0)
MCHC: 34.5 g/dL (ref 30.0–36.0)
MCV: 90.2 fl (ref 78.0–100.0)
MONOS PCT: 11.6 % (ref 3.0–12.0)
Monocytes Absolute: 1.3 10*3/uL — ABNORMAL HIGH (ref 0.1–1.0)
NEUTROS PCT: 43.8 % (ref 43.0–77.0)
Neutro Abs: 4.8 10*3/uL (ref 1.4–7.7)
PLATELETS: 390 10*3/uL (ref 150.0–400.0)
RBC: 4.9 Mil/uL (ref 4.22–5.81)
RDW: 13.4 % (ref 11.5–15.5)
WBC: 11 10*3/uL — ABNORMAL HIGH (ref 4.0–10.5)

## 2017-10-06 MED ORDER — FLUTICASONE FUROATE-VILANTEROL 100-25 MCG/INH IN AEPB: 1.0000 | INHALATION_SPRAY | Freq: Every day | RESPIRATORY_TRACT | 5 refills | Status: DC

## 2017-10-06 MED ORDER — ALBUTEROL SULFATE HFA 108 (90 BASE) MCG/ACT IN AERS: 2.0000 | INHALATION_SPRAY | Freq: Four times a day (QID) | RESPIRATORY_TRACT | 6 refills | Status: DC | PRN

## 2017-10-06 NOTE — Patient Instructions (Addendum)
Today we will get the following testing: Orders Placed This Encounter  Procedures  . CBC w/Diff  . Resp Allergy Profile Regn2DC DE MD [facility]  . Pulmonary function test   We will give you samples of Breo to be used once daily. Please rinse her mouth out after each use. Albuterol inhaler 1-2 puffs every 4-6 hours as needed for shortness of breath or wheezing. Encouraged weight loss and exercise.  RTC in 3-4 weeks with FULL PFTs
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
## 2017-10-07 LAB — RESPIRATORY ALLERGY PROFILE REGION II ~~LOC~~
Allergen, A. alternata, m6: <0.1 kU/L
Allergen, Cedar tree, t12: <0.1 kU/L
Allergen, Comm Silver Birch, t9: <0.1 kU/L
Allergen, Cottonwood, t14: <0.1 kU/L
Allergen, D pternoyssinus,d7: <0.1 kU/L
Allergen, Mouse Urine Protein, e78: <0.1 kU/L
Allergen, Oak,t7: <0.1 kU/L
Bermuda Grass: <0.1 kU/L
Box Elder IgE: <0.1 kU/L
CLADOSPORIUM HERBARUM (M2) IGE: <0.1 kU/L
CLASS: 0
CLASS: 0
CLASS: 0
CLASS: 0
CLASS: 0
CLASS: 0
CLASS: 0
CLASS: 0
CLASS: 0
COMMON RAGWEED (SHORT) (W1) IGE: 0.13 kU/L — ABNORMAL HIGH
Cat Dander: <0.1 kU/L
Class: 0
Class: 0
Class: 0
Class: 0
Class: 0
Class: 0
Class: 0
Class: 0
Class: 0
Class: 0
Class: 0
Class: 0
Class: 0
Class: 0
Class: 0
Cockroach: <0.1 kU/L
Dog Dander: <0.1 kU/L
IgE (Immunoglobulin E), Serum: 49 kU/L (ref <=114)
Pecan/Hickory Tree IgE: <0.1 kU/L
Sheep Sorrel IgE: <0.1 kU/L
Timothy Grass: <0.1 kU/L

## 2017-10-07 LAB — INTERPRETATION:

## 2017-10-10 ENCOUNTER — Telehealth: Payer: Self-pay

## 2017-10-10 DIAGNOSIS — R0609 Other forms of dyspnea: Secondary | ICD-10-CM

## 2017-10-10 MED ORDER — FLUTICASONE FUROATE-VILANTEROL 100-25 MCG/INH IN AEPB: 1.0000 | INHALATION_SPRAY | Freq: Every day | RESPIRATORY_TRACT | 5 refills | Status: DC

## 2017-10-10 NOTE — Telephone Encounter (Signed)
Called and spoke with patient. Made aware of results of lab work. No questions or concerns at this time. Breo sent to pharmacy. Nothing further needed at this time.

## 2017-10-10 NOTE — Telephone Encounter (Signed)
-----   Message from Josephine Igo, DO sent at 10/07/2017  5:33 PM EDT ----- Lowanda Foster, please let him know that his regional allergy panel was negative except for ragweed. His IgE is mildly elevated.   Thanks,  BLI  Josephine Igo, DO [facility] 10/07/2017 5:32 PM
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
## 2017-10-18 ENCOUNTER — Encounter: Payer: Self-pay | Admitting: Physician Assistant

## 2017-10-18 ENCOUNTER — Ambulatory Visit: Payer: BLUE CROSS/BLUE SHIELD | Admitting: Physician Assistant

## 2017-10-18 ENCOUNTER — Other Ambulatory Visit: Payer: BLUE CROSS/BLUE SHIELD

## 2017-10-18 VITALS — BP 110/72 | HR 70 | Ht 70.0 in | Wt 265.8 lb

## 2017-10-18 DIAGNOSIS — I10 Essential (primary) hypertension: Secondary | ICD-10-CM

## 2017-10-18 DIAGNOSIS — I5022 Chronic systolic (congestive) heart failure: Secondary | ICD-10-CM

## 2017-10-18 DIAGNOSIS — I4901 Ventricular fibrillation: Secondary | ICD-10-CM

## 2017-10-18 DIAGNOSIS — I2510 Atherosclerotic heart disease of native coronary artery without angina pectoris: Secondary | ICD-10-CM

## 2017-10-18 DIAGNOSIS — E785 Hyperlipidemia, unspecified: Secondary | ICD-10-CM

## 2017-10-18 DIAGNOSIS — I480 Paroxysmal atrial fibrillation: Secondary | ICD-10-CM

## 2017-10-18 NOTE — Patient Instructions (Signed)
Medication Instructions:  Your physician recommends that you continue on your current medications as directed. Please refer to the Current Medication list given to you today.  If you need a refill on your cardiac medications before your next appointment, please call your pharmacy.   Lab work: FASTING LABS ALREADY ORDERED  If you have labs (blood work) drawn today and your tests are completely normal, you will receive your results only by: Marland Kitchen MyChart Message (if you have MyChart) OR . A paper copy in the mail If you have any lab test that is abnormal or we need to change your treatment, we will call you to review the results.  Testing/Procedures: NONE ORDERED TODAY  Follow-Up: At [facility], you and your health needs are our priority.  As part of our continuing mission to provide you with exceptional heart care, we have created designated Provider Care Teams.  These Care Teams include your primary Cardiologist (physician) and Advanced Practice Providers (APPs -  Physician Assistants and Nurse Practitioners) who all work together to provide you with the care you need, when you need it. You will need a follow up appointment in:  5 months.  Please call our office 2 months in advance to schedule this appointment.  You may see Tonny Bollman, MD or one of the following Advanced Practice Providers on your designated Care Team: Tereso Newcomer, [place] 02/01/18 @ 12:15 PM  Vin Bhagat, PA-C . Berton Bon, NP  Any Other Special Instructions Will Be Listed Below (If Applicable).

## 2017-10-19 ENCOUNTER — Telehealth: Payer: Self-pay | Admitting: *Deleted

## 2017-10-19 DIAGNOSIS — E785 Hyperlipidemia, unspecified: Secondary | ICD-10-CM

## 2017-10-19 DIAGNOSIS — I2510 Atherosclerotic heart disease of native coronary artery without angina pectoris: Secondary | ICD-10-CM

## 2017-10-19 LAB — CUP PACEART REMOTE DEVICE CHECK
Brady Statistic AS VP Percent: 0 %
Brady Statistic AS VS Percent: 100 %
Brady Statistic RV Percent Paced: 0 %
Date Time Interrogation Session: 20191009054412
HIGH POWER IMPEDANCE MEASURED VALUE: 85 Ohm
Implantable Lead Location: 753860
Implantable Lead Serial Number: 49533838
Lead Channel Impedance Value: 466 Ohm
Lead Channel Setting Pacing Amplitude: 2.5 V
Lead Channel Setting Pacing Pulse Width: 0.4 ms
MDC IDC LEAD IMPLANT DT: 20171102
MDC IDC MSMT BATTERY REMAINING PERCENTAGE: 100 %
MDC IDC MSMT BATTERY VOLTAGE: 3.11 V
MDC IDC PG IMPLANT DT: 20171102
MDC IDC SET LEADCHNL RV SENSING SENSITIVITY: 0.8 mV
Pulse Gen Model: 404633
Pulse Gen Serial Number: 60940552

## 2017-10-19 LAB — HEPATIC FUNCTION PANEL
ALBUMIN: 4.9 g/dL (ref 3.5–5.5)
ALT: 27 IU/L (ref 0–44)
AST: 25 IU/L (ref 0–40)
Alkaline Phosphatase: 72 IU/L (ref 39–117)
BILIRUBIN TOTAL: 0.6 mg/dL (ref 0.0–1.2)
Bilirubin, Direct: 0.15 mg/dL (ref 0.00–0.40)
Total Protein: 7.4 g/dL (ref 6.0–8.5)

## 2017-10-19 LAB — LIPID PANEL
CHOLESTEROL TOTAL: 210 mg/dL — AB (ref 100–199)
Chol/HDL Ratio: 6.2 ratio — ABNORMAL HIGH (ref 0.0–5.0)
HDL: 34 mg/dL — AB (ref >39)
LDL Calculated: 133 mg/dL — ABNORMAL HIGH (ref 0–99)
Triglycerides: 217 mg/dL — ABNORMAL HIGH (ref 0–149)
VLDL Cholesterol Cal: 43 mg/dL — ABNORMAL HIGH (ref 5–40)

## 2017-10-19 NOTE — Telephone Encounter (Signed)
-----   Message from Scott T Weaver, PA-C sent at 10/19/2017  7:05 AM EDT ----- Lipids are above goal.  LFTs are ok. Goal LDL is < 70.  It is higher now than it was 3 mos ago. Recommendations:  - Refer to the [facility] Scott Weaver, PA-C    10/19/2017 7:02 AM 

## 2017-10-19 NOTE — Telephone Encounter (Signed)
Left message to go over lab results and recommendations.  

## 2017-10-20 NOTE — Telephone Encounter (Signed)
Pt called back though I was with another pt at the time. Left message x 2 to go over results.

## 2017-10-20 NOTE — Telephone Encounter (Signed)
-----   Message from Scott T Weaver, PA-C sent at 10/19/2017  7:05 AM EDT ----- Lipids are above goal.  LFTs are ok. Goal LDL is < 70.  It is higher now than it was 3 mos ago. Recommendations:  - Refer to the [facility] Scott Weaver, PA-C    10/19/2017 7:02 AM 

## 2017-10-20 NOTE — Telephone Encounter (Signed)
Follow up  ° ° °Patient is returning call in reference to lab results. Please call  °

## 2017-10-20 NOTE — Telephone Encounter (Signed)
-----   Message from Beatrice Lecher, [place] sent at 10/19/2017  7:05 AM EDT ----- Lipids are above goal.  LFTs are ok. Goal LDL is < 70.  It is higher now than it was 3 mos ago. Recommendations:  - Refer to the [facility] Clinic [place], [place]    10/19/2017 7:02 AM

## 2017-10-20 NOTE — Telephone Encounter (Signed)
Pt has been notified of lab results. Pt states he has been working very hard on trying to lower his cholesterol numbers. Pt advised of referral to Pharm-D to discuss PCS[facility]-9. Pt is agreeable to plan of care and thanked me for the call.

## 2017-10-25 NOTE — Patient Instructions (Addendum)
Thank you for visiting Dr. Tonia Brooms at [facility]. Today we recommend the following: Orders Placed This Encounter  Procedures  . AMB referral to pulmonary rehabilitation   No orders of the defined types were placed in this encounter.  Return in about 3 months (around 01/26/2018).
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
## 2017-10-26 ENCOUNTER — Encounter: Payer: Self-pay | Admitting: Pulmonary Disease

## 2017-10-26 ENCOUNTER — Ambulatory Visit: Payer: BLUE CROSS/BLUE SHIELD

## 2017-10-26 ENCOUNTER — Ambulatory Visit: Payer: BLUE CROSS/BLUE SHIELD | Admitting: Pulmonary Disease

## 2017-10-26 VITALS — BP 108/78 | HR 68 | Ht 70.25 in | Wt 265.0 lb

## 2017-10-26 DIAGNOSIS — K219 Gastro-esophageal reflux disease without esophagitis: Secondary | ICD-10-CM

## 2017-10-26 DIAGNOSIS — R0609 Other forms of dyspnea: Secondary | ICD-10-CM

## 2017-10-26 DIAGNOSIS — I5022 Chronic systolic (congestive) heart failure: Secondary | ICD-10-CM

## 2017-10-26 DIAGNOSIS — I480 Paroxysmal atrial fibrillation: Secondary | ICD-10-CM

## 2017-10-26 LAB — PULMONARY FUNCTION TEST
DL/VA % PRED: 124 %
DL/VA: 5.84 ml/min/mmHg/L
DLCO UNC % PRED: 108 %
DLCO cor % pred: 106 %
DLCO cor: 34.89 ml/min/mmHg
DLCO unc: 35.47 ml/min/mmHg
FEF 25-75 PRE: 2.85 L/s
FEF 25-75 Post: 3.78 L/sec
FEF2575-%CHANGE-POST: 32 %
FEF2575-%Pred-Post: 99 %
FEF2575-%Pred-Pre: 75 %
FEV1-%Change-Post: 7 %
FEV1-%PRED-PRE: 83 %
FEV1-%Pred-Post: 90 %
FEV1-POST: 3.73 L
FEV1-PRE: 3.46 L
FEV1FVC-%Change-Post: 5 %
FEV1FVC-%Pred-Pre: 97 %
FEV6-%CHANGE-POST: 1 %
FEV6-%PRED-POST: 89 %
FEV6-%PRED-PRE: 88 %
FEV6-Post: 4.56 L
FEV6-Pre: 4.49 L
FEV6FVC-%CHANGE-POST: 0 %
FEV6FVC-%Pred-Post: 102 %
FEV6FVC-%Pred-Pre: 102 %
FVC-%Change-Post: 1 %
FVC-%PRED-POST: 87 %
FVC-%Pred-Pre: 85 %
FVC-Post: 4.58 L
POST FEV6/FVC RATIO: 99 %
Post FEV1/FVC ratio: 81 %
Pre FEV1/FVC ratio: 77 %
Pre FEV6/FVC Ratio: 100 %
RV % PRED: 101 %
RV: 1.95 L
TLC % pred: 89 %
TLC: 6.27 L

## 2017-10-31 ENCOUNTER — Telehealth: Payer: Self-pay

## 2017-10-31 DIAGNOSIS — R0609 Other forms of dyspnea: Secondary | ICD-10-CM

## 2017-10-31 NOTE — Progress Notes (Signed)
If you think it will help his management, he should be able to tolerate a cardiopulmonary exercise test. Thank you.

## 2017-10-31 NOTE — Telephone Encounter (Signed)
William Bollman, MD at 10/26/2017 1:30 PM   Status: Signed    If you think it will help his management, he should be able to tolerate a cardiopulmonary exercise test. Thank you.     Will call to arrange cardiopulmonary stress test tomorrow.

## 2017-10-31 NOTE — Telephone Encounter (Signed)
-----   Message from Tonny Bollman, MD sent at 10/31/2017 12:36 PM EDT ----- Regarding: RE: CPET Will do. thanks ----- Message ----- From: Josephine Igo, DO Sent: 10/31/2017   8:47 AM EDT To: Beatrice Lecher, PA-C, Tonny Bollman, MD Subject: CPET                                           Sounds good to me. You can order it especially if you are going to decide to maybe cath him on the results. That way the results will come back to right person in Epic and not overlooked. Let me know if I can do anything else.    Thanks  Elige Radon     ----- Message ----- From: Kennon Rounds Sent: 10/27/2017   9:01 AM EDT To: Rachel Bo Icard, DO  I think that is a great idea.  If he has mainly a circulatory component contributing to his shortness of breath, it will help [place] to decide +/- Cardiac Catheterization and maybe referral to the CHF clinic. Do you want me to order it or do you want to order it? I'm happy to order it if you prefer. Thanks! Scott  ----- Message ----- From: Josephine Igo, DO Sent: 10/26/2017   3:25 PM EDT To: Beatrice Lecher, PA-C  Hello, I saw Mr. Jorge today in the office. His PFTs have PRISm pattern, probably related to his weight, otherwise no obstruction, his symptoms are not totally consistent with asthma. To further his evaluation of DOE do you think he would tolerate a CPET? Or do we think its primarily related to his CHF? Thanks for the help,  Kelby Fam, DO [facility] 10/26/2017 3:24 PM

## 2017-11-01 ENCOUNTER — Other Ambulatory Visit: Payer: Self-pay

## 2017-11-01 DIAGNOSIS — I5022 Chronic systolic (congestive) heart failure: Secondary | ICD-10-CM

## 2017-11-01 NOTE — Addendum Note (Signed)
Addended by: Kerin Ransom on: 11/01/2017 02:28 PM   Modules accepted: Orders

## 2017-11-02 NOTE — Telephone Encounter (Signed)
Reviewed with Dr. Tonia Brooms (pulmonology). Please arrange cardiopulmonary stress test. Tereso Newcomer, PA-C    11/02/2017 10:04 AM

## 2017-11-02 NOTE — Telephone Encounter (Signed)
CPX ordered.  Instructions reviewed with patient. He was grateful for call and agrees with treatment plan.

## 2017-11-03 ENCOUNTER — Telehealth (HOSPITAL_COMMUNITY): Payer: Self-pay

## 2017-11-03 ENCOUNTER — Telehealth (HOSPITAL_COMMUNITY): Payer: Self-pay | Admitting: *Deleted

## 2017-11-03 NOTE — Telephone Encounter (Signed)
Called and spoke with pt in regards to Cardiac Rehab - Pt stated he is interested but he would not be able to afford 30% co-insurance as he would not be able to start CR until 12/30 when his insurance runs out. He stated he will think on it and call back when and if he can do program. Closed referral.

## 2017-11-03 NOTE — Telephone Encounter (Signed)
Pt's insurance is active and benefits verified through [facility] - No co-pay, deductible amount of $200.00/$200.00 has been met, out of pocket amount of $600.00/$600.00 has been met, no co-insurance, and no pre-authorization is required. Passport/reference [id number]

## 2017-11-03 NOTE — Telephone Encounter (Signed)
CPX has been scheduled 11/4.

## 2017-11-03 NOTE — Telephone Encounter (Signed)
-----   Message from Tonny Bollman, MD sent at 11/03/2017 11:03 AM EDT ----- Regarding: RE: Cardiac Rehab Yes he can start cardiac rehab thank you! ----- Message ----- From: Chelsea Aus, RN Sent: 11/03/2017   9:47 AM EDT To: Beatrice Lecher, PA-C, Tonny Bollman, MD Subject: Cardiac Rehab                                  The above patient was referred to pulmonary rehab by Dr. Tonia Brooms.  Reviewed his medical history and his EF qualifies him to participate in Cardiac Rehab.  A new referral was placed by Dr. Tonia Brooms for cardiac rehab. Pt recently seen on 10/8 by Scott.  Looks like he may have a cardiopulmonary exercise test in the near future Okay from a cardiac standpoint to proceed with cardiac rehab? Note we are scheduling new patients for middle of December.  Thanks so much for your valued input Pharmacist, hospital, BSN Cardiac and Pulmonary Rehab Nurse Navigator

## 2017-11-14 ENCOUNTER — Ambulatory Visit (HOSPITAL_COMMUNITY): Payer: BLUE CROSS/BLUE SHIELD | Attending: Physician Assistant

## 2017-11-14 DIAGNOSIS — R0609 Other forms of dyspnea: Secondary | ICD-10-CM

## 2017-11-15 ENCOUNTER — Encounter: Payer: Self-pay | Admitting: Cardiology

## 2017-11-15 ENCOUNTER — Ambulatory Visit: Payer: BLUE CROSS/BLUE SHIELD | Admitting: Cardiology

## 2017-11-15 ENCOUNTER — Ambulatory Visit (INDEPENDENT_AMBULATORY_CARE_PROVIDER_SITE_OTHER): Payer: BLUE CROSS/BLUE SHIELD | Admitting: Pharmacist

## 2017-11-15 VITALS — BP 114/78 | HR 58 | Ht 70.5 in | Wt 267.0 lb

## 2017-11-15 DIAGNOSIS — I4901 Ventricular fibrillation: Secondary | ICD-10-CM | POA: Diagnosis not present

## 2017-11-15 DIAGNOSIS — I255 Ischemic cardiomyopathy: Secondary | ICD-10-CM

## 2017-11-15 DIAGNOSIS — I10 Essential (primary) hypertension: Secondary | ICD-10-CM

## 2017-11-15 DIAGNOSIS — I25810 Atherosclerosis of coronary artery bypass graft(s) without angina pectoris: Secondary | ICD-10-CM | POA: Diagnosis not present

## 2017-11-15 DIAGNOSIS — E785 Hyperlipidemia, unspecified: Secondary | ICD-10-CM

## 2017-11-15 LAB — CUP PACEART INCLINIC DEVICE CHECK
Date Time Interrogation Session: 20191105151154
HighPow Impedance: 83 Ohm
Implantable Lead Implant Date: 20171102
Implantable Lead Location: 753860
Implantable Lead Model: 414005
Implantable Lead Serial Number: 49533838
Lead Channel Pacing Threshold Amplitude: 0.5 V
Lead Channel Pacing Threshold Pulse Width: 0.4 ms
Lead Channel Sensing Intrinsic Amplitude: 24.2 mV
Lead Channel Sensing Intrinsic Amplitude: 9.3 mV
MDC IDC MSMT BATTERY VOLTAGE: 3.1 V
MDC IDC MSMT LEADCHNL RV IMPEDANCE VALUE: 477 Ohm
MDC IDC PG IMPLANT DT: 20171102
MDC IDC SET LEADCHNL RV PACING AMPLITUDE: 2.5 V
MDC IDC SET LEADCHNL RV PACING PULSEWIDTH: 0.4 ms
MDC IDC SET LEADCHNL RV SENSING SENSITIVITY: 0.8 mV
MDC IDC STAT BRADY RV PERCENT PACED: 0 %
Pulse Gen Serial Number: 60940552

## 2017-11-15 NOTE — Patient Instructions (Addendum)
WE will send for coverage of Repatha 140mg  injection every 14 days. We will call once approved. Please activate your copay card and take to the pharmacy.   If you have any questions or concerns please call the clinic at [phone]   Cholesterol Cholesterol is a fat. Your body needs a small amount of cholesterol. Cholesterol (plaque) may build up in your blood vessels (arteries). That makes you more likely to have a heart attack or stroke. You cannot feel your cholesterol level. Having a blood test is the only way to find out if your level is high. Keep your test results. Work with your doctor to keep your cholesterol at a good level. What do the results mean?  Total cholesterol is how much cholesterol is in your blood.  LDL is bad cholesterol. This is the type that can build up. Try to have low LDL.  HDL is good cholesterol. It cleans your blood vessels and carries LDL away. Try to have high HDL.  Triglycerides are fat that the body can store or burn for energy. What are good levels of cholesterol?  Total cholesterol below 200.  LDL below 100 is good for people who have health risks. LDL below 70 is good for people who have very high risks.  HDL above 40 is good. It is best to have HDL of 60 or higher.  Triglycerides below 150. How can I lower my cholesterol? Diet Follow your diet program as told by your doctor.  Choose fish, white meat chicken, or [place] that is roasted or baked. Try not to eat red meat, fried foods, sausage, or lunch meats.  Eat lots of fresh fruits and vegetables.  Choose whole grains, beans, pasta, potatoes, and cereals.  Choose olive oil, corn oil, or canola oil. Only use small amounts.  Try not to eat butter, mayonnaise, shortening, or palm kernel oils.  Try not to eat foods with trans fats.  Choose low-fat or nonfat dairy foods. ? Drink skim or nonfat milk. ? Eat low-fat or nonfat yogurt and cheeses. ? Try not to drink whole milk or cream. ? Try  not to eat ice cream, egg yolks, or full-fat cheeses.  Healthy desserts include angel food cake, ginger snaps, animal crackers, hard candy, popsicles, and low-fat or nonfat frozen yogurt. Try not to eat pastries, cakes, pies, and cookies.  Exercise Follow your exercise program as told by your doctor.  Be more active. Try gardening, walking, and taking the stairs.  Ask your doctor about ways that you can be more active.  Medicine  Take over-the-counter and prescription medicines only as told by your doctor. This information is not intended to replace advice given to you by your health care provider. Make sure you discuss any questions you have with your health care provider. Document Released: 03/26/2008 Document Revised: 07/30/2015 Document Reviewed: 07/10/2015 Elsevier Interactive Patient Education  [employer].

## 2017-11-15 NOTE — Patient Instructions (Signed)
Medication Instructions:  Your physician recommends that you continue on your current medications as directed. Please refer to the Current Medication list given to you today.  If you need a refill on your cardiac medications before your next appointment, please call your pharmacy.   Lab work: None ordered  Testing/Procedures: None ordered  Follow-Up: Remote monitoring is used to monitor your Pacemaker of ICD from home. This monitoring reduces the number of office visits required to check your device to one time per year. It allows [place] to keep an eye on the functioning of your device to ensure it is working properly. You are scheduled for a device check from home on 12/28/2017. You may send your transmission at any time that day. If you have a wireless device, the transmission will be sent automatically. After your physician reviews your transmission, you will receive a postcard with your next transmission date.  At [facility], you and your health needs are our priority.  As part of our continuing mission to provide you with exceptional heart care, we have created designated Provider Care Teams.  These Care Teams include your primary Cardiologist (physician) and Advanced Practice Providers (APPs -  Physician Assistants and Nurse Practitioners) who all work together to provide you with the care you need, when you need it. You will need a follow up appointment in 6 months.  Please call our office 2 months in advance to schedule this appointment.  You may see Will Jorja Loa, MD or one of the following Advanced Practice Providers on your designated Care Team:   Gypsy Balsam, NP . Francis Dowse, PA-C  Thank you for choosing [facility]!!   Dory Horn, RN [phone]
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
## 2017-11-16 ENCOUNTER — Telehealth: Payer: Self-pay | Admitting: *Deleted

## 2017-11-16 NOTE — Telephone Encounter (Signed)
-----   Message from Beatrice Lecher, [place] sent at 11/15/2017  5:11 PM EST ----- Please tell the patient that the cardiopulmonary stress test does not demonstrate any evidence of heart failure or coronary artery disease contributing to his shortness of breath.  His shortness of breath seems to be related to his weight and lung restriction related to his weight.   Recommendations:  - Continue to work on weight loss and slowly increasing activity.  - I will send this to his lung doctor (Dr. Tonia Brooms) as well. Tereso Newcomer, PA-C    11/15/2017 5:00 PM

## 2017-11-16 NOTE — Telephone Encounter (Signed)
Pt has been notified of CPX results and recommendations. Pt agreeable to plan of care and thanked me for the call.

## 2017-11-30 ENCOUNTER — Telehealth: Payer: Self-pay | Admitting: Pharmacist

## 2017-11-30 MED ORDER — ALIROCUMAB 75 MG/ML ~~LOC~~ SOAJ: 75.0000 mg | SUBCUTANEOUS | 11 refills | Status: DC

## 2017-11-30 NOTE — Telephone Encounter (Signed)
Pt approved for Praluent 150mg  every 14 days. RX sent to pharmacy.   Pt aware to call once started and that Rx sent to pharmacy.

## 2017-12-05 ENCOUNTER — Ambulatory Visit: Payer: BLUE CROSS/BLUE SHIELD | Admitting: Family Medicine

## 2017-12-05 ENCOUNTER — Telehealth: Payer: Self-pay | Admitting: Emergency Medicine

## 2017-12-05 ENCOUNTER — Encounter: Payer: Self-pay | Admitting: Family Medicine

## 2017-12-05 DIAGNOSIS — J320 Chronic maxillary sinusitis: Secondary | ICD-10-CM | POA: Diagnosis not present

## 2017-12-05 DIAGNOSIS — J329 Chronic sinusitis, unspecified: Secondary | ICD-10-CM | POA: Insufficient documentation

## 2017-12-05 MED ORDER — DOXYCYCLINE HYCLATE 100 MG PO TABS: 100.0000 mg | ORAL_TABLET | Freq: Two times a day (BID) | ORAL | 0 refills | Status: AC

## 2017-12-05 NOTE — Telephone Encounter (Signed)
Office notes updated

## 2017-12-05 NOTE — Telephone Encounter (Signed)
Left a VM for patient to give the office a call to inquire about the reason for OV today

## 2017-12-05 NOTE — Assessment & Plan Note (Signed)
-  Rx for doxycycline -Increase fluids a bit to help thin mucus, pay attention to weights.  -Rest -Humidifier at home may be helpful as well.
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
## 2017-12-05 NOTE — Patient Instructions (Signed)

## 2017-12-15 IMAGING — DX DG CHEST 2V
2 series · 2 of 2 positions shown · non-contrast
Comparison: 06/09/2016

CLINICAL DATA: Cough for 2 weeks

EXAM:
CHEST  2 VIEW

[chest pa]
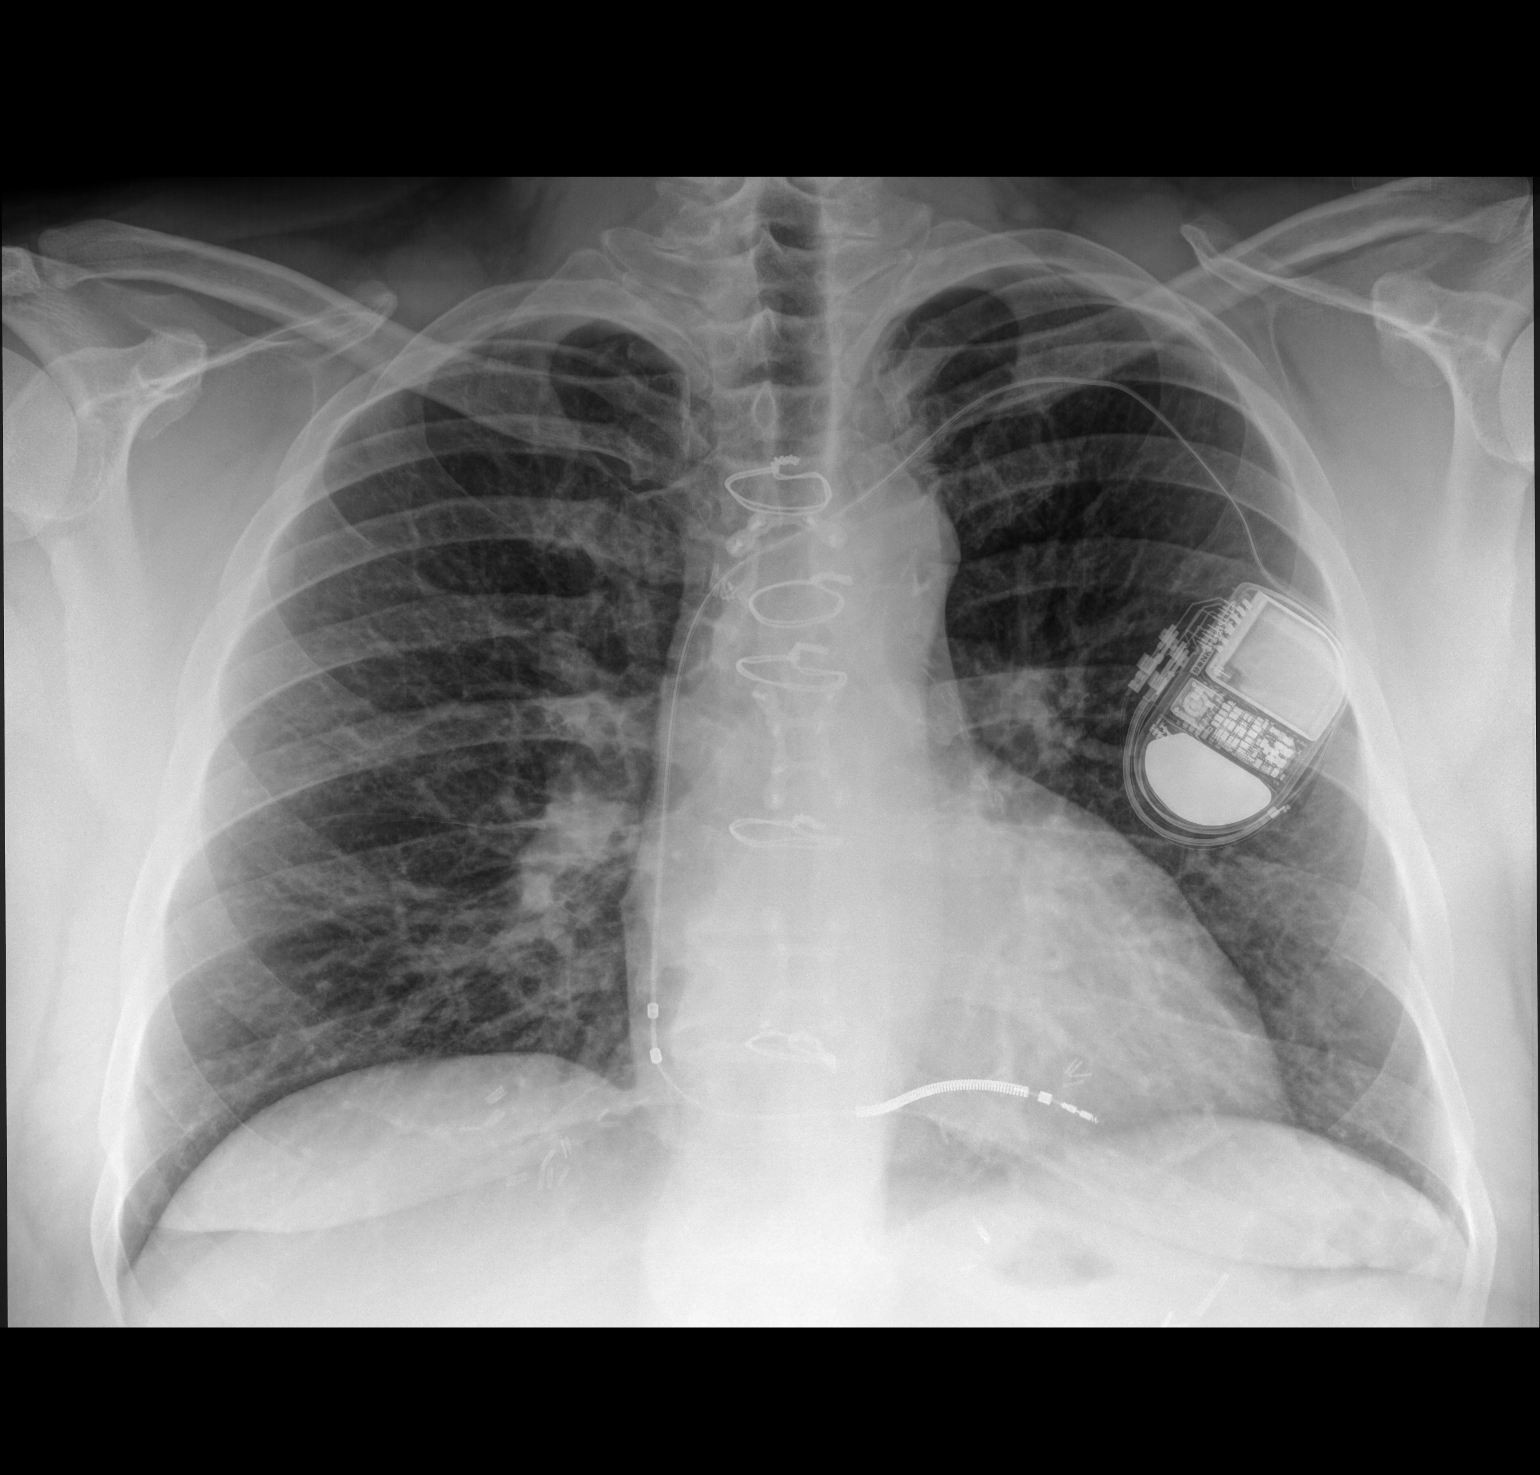

[chest lat]
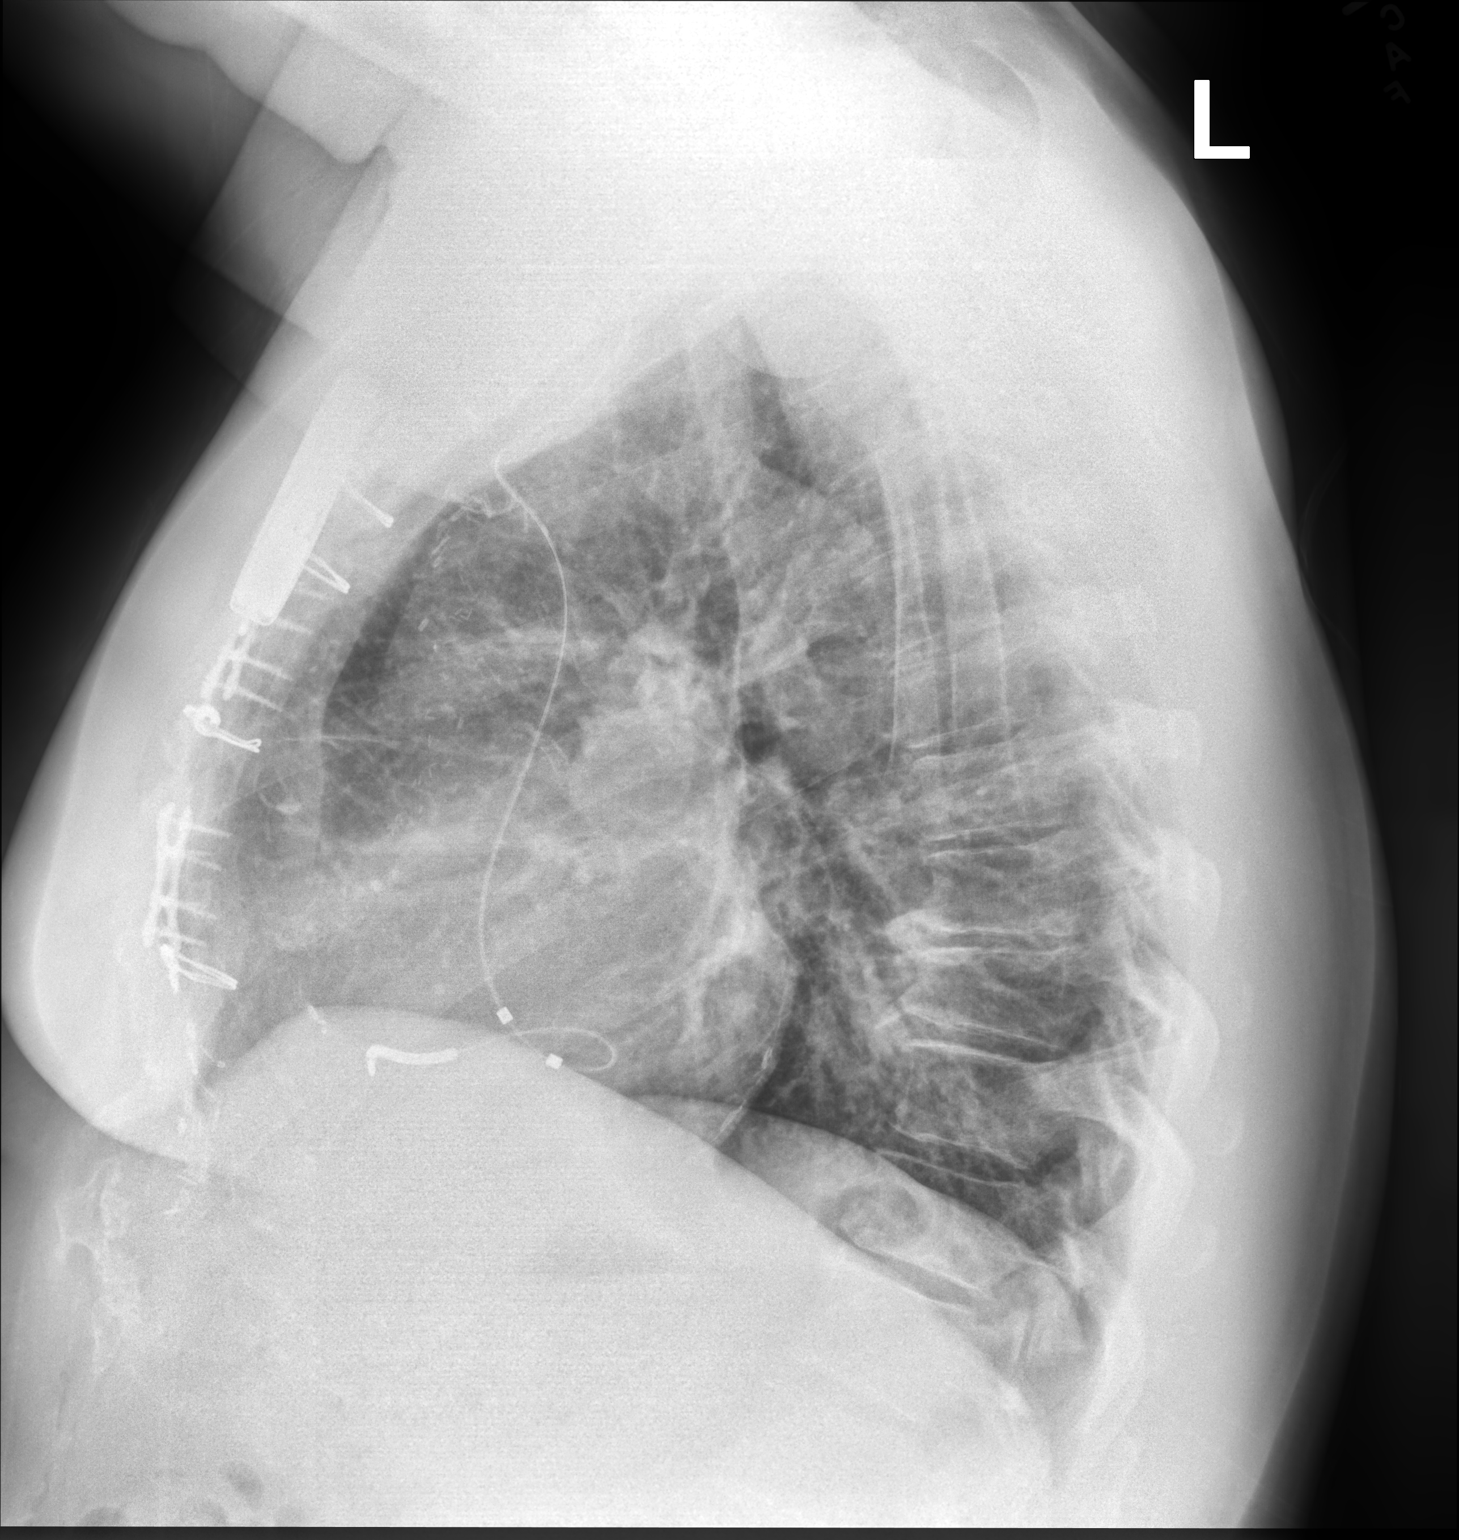

[2 of 2 positions shown; findings below may reference images not displayed]

FINDINGS: Cardiac shadow is enlarged but stable. Defibrillator is noted as
well as postsurgical changes. The lungs are well aerated bilaterally
without focal infiltrate. Degenerative changes of thoracic spine are
noted.
IMPRESSION: No active cardiopulmonary disease.

## 2017-12-19 ENCOUNTER — Telehealth: Payer: Self-pay | Admitting: Pulmonary Disease

## 2017-12-19 NOTE — Telephone Encounter (Signed)
LMTCB

## 2017-12-20 ENCOUNTER — Other Ambulatory Visit: Payer: Self-pay | Admitting: Family Medicine

## 2017-12-20 ENCOUNTER — Other Ambulatory Visit: Payer: Self-pay | Admitting: Cardiology

## 2017-12-20 DIAGNOSIS — I4901 Ventricular fibrillation: Secondary | ICD-10-CM

## 2017-12-20 NOTE — Telephone Encounter (Signed)
Noted  

## 2017-12-20 NOTE — Telephone Encounter (Signed)
PCCM:  I called and spoke with the patient regarding his recent cardiopulmonary exercise test.  His limitation was plated to physicality and his weight.  Josephine IgoBradley L Lakeyia Surber, DO [facility] 12/20/2017 5:09 PM

## 2017-12-20 NOTE — Telephone Encounter (Signed)
Called patient, unable to reach left message to give us a call back. 

## 2017-12-20 NOTE — Telephone Encounter (Signed)
Patient returned call, CB is [phone]

## 2017-12-20 NOTE — Telephone Encounter (Signed)
Called and spoke to patient. States the cardiologist told him the results and how he doesn't think the shortness of breath is coming from CHF or Coronary artery disease.  Patient wants to know what Dr. Myrlene BrokerIcard's thoughts are from a pulmonary standpoint related to the test.  Dr. Tonia BroomsIcard please advise

## 2017-12-28 ENCOUNTER — Ambulatory Visit (INDEPENDENT_AMBULATORY_CARE_PROVIDER_SITE_OTHER): Payer: BLUE CROSS/BLUE SHIELD

## 2017-12-28 DIAGNOSIS — I255 Ischemic cardiomyopathy: Secondary | ICD-10-CM | POA: Diagnosis not present

## 2017-12-28 NOTE — Progress Notes (Signed)
Remote ICD transmission.   

## 2017-12-29 ENCOUNTER — Encounter: Payer: Self-pay | Admitting: Cardiology

## 2018-01-06 ENCOUNTER — Telehealth: Payer: Self-pay

## 2018-01-06 DIAGNOSIS — E785 Hyperlipidemia, unspecified: Secondary | ICD-10-CM

## 2018-01-06 NOTE — Telephone Encounter (Signed)
Called to speak with pt about scheduling labs for 8 week post 1st dose of praluent but pt informed me that they changed insurance and the new insurance no longer allows them to go to [facility] pt requested that I cancel all upcoming [facility] appts

## 2018-01-26 ENCOUNTER — Ambulatory Visit: Payer: BLUE CROSS/BLUE SHIELD | Admitting: Pulmonary Disease

## 2018-01-29 LAB — CUP PACEART REMOTE DEVICE CHECK
Date Time Interrogation Session: 20200119111534
Implantable Lead Implant Date: 20171102
Implantable Lead Location: 753860
Implantable Lead Model: 414005
Implantable Lead Serial Number: 49533838
MDC IDC PG IMPLANT DT: 20171102
Pulse Gen Model: 404633
Pulse Gen Serial Number: 60940552

## 2018-02-01 ENCOUNTER — Ambulatory Visit: Payer: BLUE CROSS/BLUE SHIELD | Admitting: Physician Assistant

## 2018-03-13 ENCOUNTER — Other Ambulatory Visit: Payer: Self-pay

## 2018-03-13 ENCOUNTER — Emergency Department (HOSPITAL_COMMUNITY)
Admission: EM | Admit: 2018-03-13 | Discharge: 2018-03-14 | Disposition: A | Discharge status: Home / Self Care | Payer: BLUE CROSS/BLUE SHIELD | Attending: Emergency Medicine | Admitting: Emergency Medicine

## 2018-03-13 DIAGNOSIS — Z79899 Other long term (current) drug therapy: Secondary | ICD-10-CM | POA: Diagnosis not present

## 2018-03-13 DIAGNOSIS — Z951 Presence of aortocoronary bypass graft: Secondary | ICD-10-CM | POA: Insufficient documentation

## 2018-03-13 DIAGNOSIS — Z95810 Presence of automatic (implantable) cardiac defibrillator: Secondary | ICD-10-CM | POA: Diagnosis not present

## 2018-03-13 DIAGNOSIS — Z91040 Latex allergy status: Secondary | ICD-10-CM | POA: Insufficient documentation

## 2018-03-13 DIAGNOSIS — I252 Old myocardial infarction: Secondary | ICD-10-CM | POA: Diagnosis not present

## 2018-03-13 DIAGNOSIS — I10 Essential (primary) hypertension: Secondary | ICD-10-CM | POA: Insufficient documentation

## 2018-03-13 DIAGNOSIS — Z87891 Personal history of nicotine dependence: Secondary | ICD-10-CM | POA: Insufficient documentation

## 2018-03-13 DIAGNOSIS — R0789 Other chest pain: Secondary | ICD-10-CM | POA: Insufficient documentation

## 2018-03-13 DIAGNOSIS — Z7982 Long term (current) use of aspirin: Secondary | ICD-10-CM | POA: Diagnosis not present

## 2018-03-13 DIAGNOSIS — I2510 Atherosclerotic heart disease of native coronary artery without angina pectoris: Secondary | ICD-10-CM | POA: Insufficient documentation

## 2018-03-13 DIAGNOSIS — R079 Chest pain, unspecified: Secondary | ICD-10-CM

## 2018-03-13 DIAGNOSIS — I5022 Chronic systolic (congestive) heart failure: Secondary | ICD-10-CM | POA: Insufficient documentation

## 2018-03-13 LAB — I-STAT TROPONIN, ED: TROPONIN I, POC: 0.01 ng/mL (ref 0.00–0.08)

## 2018-03-13 MED ORDER — SODIUM CHLORIDE 0.9% FLUSH: 3.0000 mL | Freq: Once | INTRAVENOUS | Status: DC

## 2018-03-13 NOTE — ED Triage Notes (Signed)
Pt here with central chest pain for a week. Pain up into the right neck and left shoulder.  Hx of open heart surgery with pacemaker defibrillator .  A&Ox4. No nausea or emesis.

## 2018-03-14 ENCOUNTER — Emergency Department (HOSPITAL_COMMUNITY): Payer: BLUE CROSS/BLUE SHIELD

## 2018-03-14 LAB — BASIC METABOLIC PANEL
Anion gap: 9 (ref 5–15)
BUN: 16 mg/dL (ref 6–20)
CO2: 25 mmol/L (ref 22–32)
Calcium: 9.6 mg/dL (ref 8.9–10.3)
Chloride: 104 mmol/L (ref 98–111)
Creatinine, Ser: 0.8 mg/dL (ref 0.61–1.24)
GFR calc Af Amer: >60 mL/min (ref >60)
Glucose, Bld: 85 mg/dL (ref 70–99)
Potassium: 4.3 mmol/L (ref 3.5–5.1)
Sodium: 138 mmol/L (ref 135–145)

## 2018-03-14 LAB — CBC
HCT: 45.2 % (ref 39.0–52.0)
Hemoglobin: 15.4 g/dL (ref 13.0–17.0)
MCH: 31 pg (ref 26.0–34.0)
MCHC: 34.1 g/dL (ref 30.0–36.0)
MCV: 91.1 fL (ref 80.0–100.0)
Platelets: 445 10*3/uL — ABNORMAL HIGH (ref 150–400)
RBC: 4.96 MIL/uL (ref 4.22–5.81)
RDW: 13.4 % (ref 11.5–15.5)
WBC: 14.6 10*3/uL — ABNORMAL HIGH (ref 4.0–10.5)
nRBC: 0 % (ref 0.0–0.2)

## 2018-03-14 LAB — I-STAT TROPONIN, ED: Troponin i, poc: 0.01 ng/mL (ref 0.00–0.08)
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
## 2018-03-14 NOTE — ED Notes (Signed)
Patient verbalizes understanding of discharge instructions. Opportunity for questioning and answers were provided. Armband removed by staff, pt discharged from ED home via POV with family. 

## 2018-03-14 NOTE — ED Notes (Addendum)
Pt reports chest pain that has been occurring intermittently. Pt reports the pain is a sharp/burning/crampy/gripping type pain.

## 2018-03-14 NOTE — ED Notes (Signed)
Called [employer] for interogation

## 2018-03-14 NOTE — Discharge Instructions (Signed)
Labs, EKG, and chest x-ray today looked good. Follow-up with your cardiologist as soon as possible. Return here for any new/acute changes.

## 2018-03-31 IMAGING — DX DG CHEST 2V
2 series · 2 of 2 positions shown · non-contrast
Comparison: 11/02/2016

CLINICAL DATA: Recurrent upper respiratory tract infections with
cough

EXAM:
CHEST  2 VIEW

[chest pa]
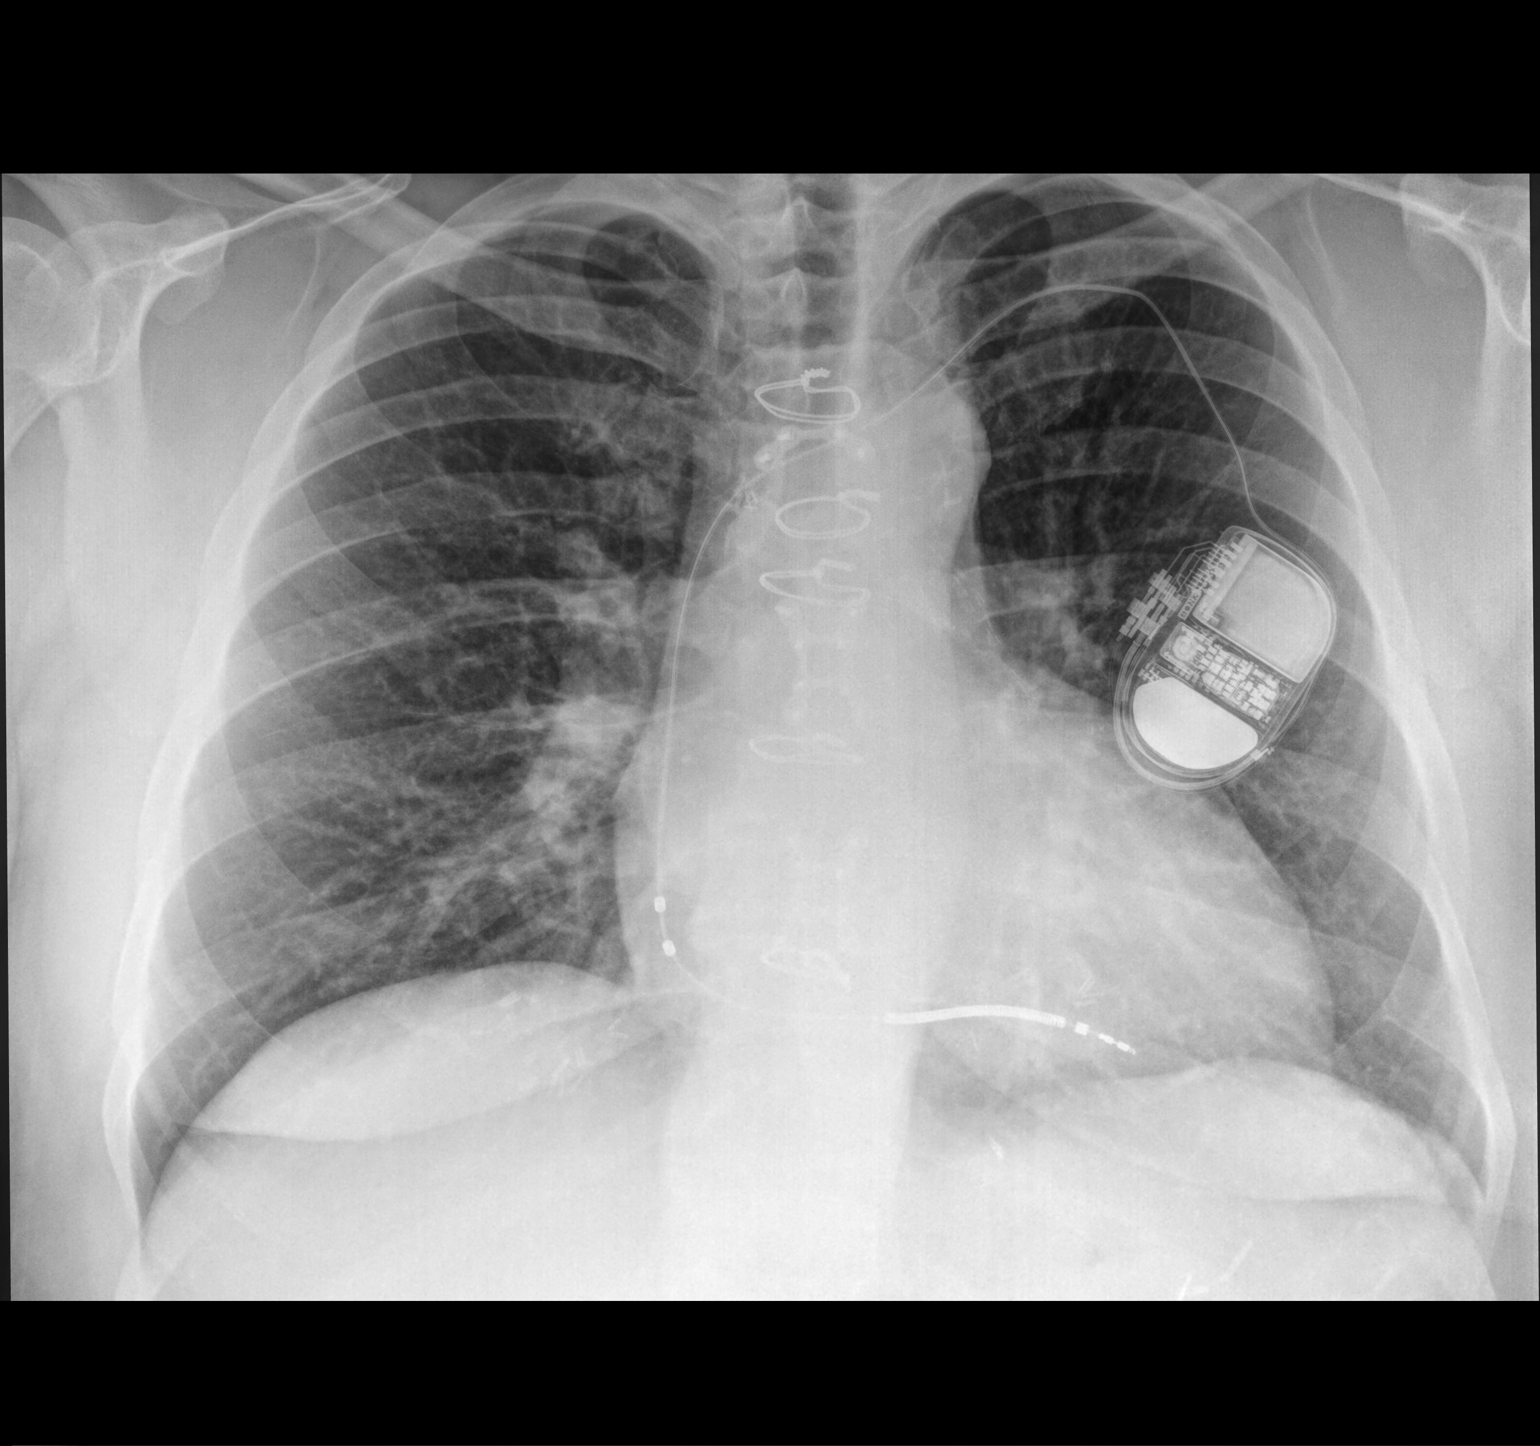

[chest lat]
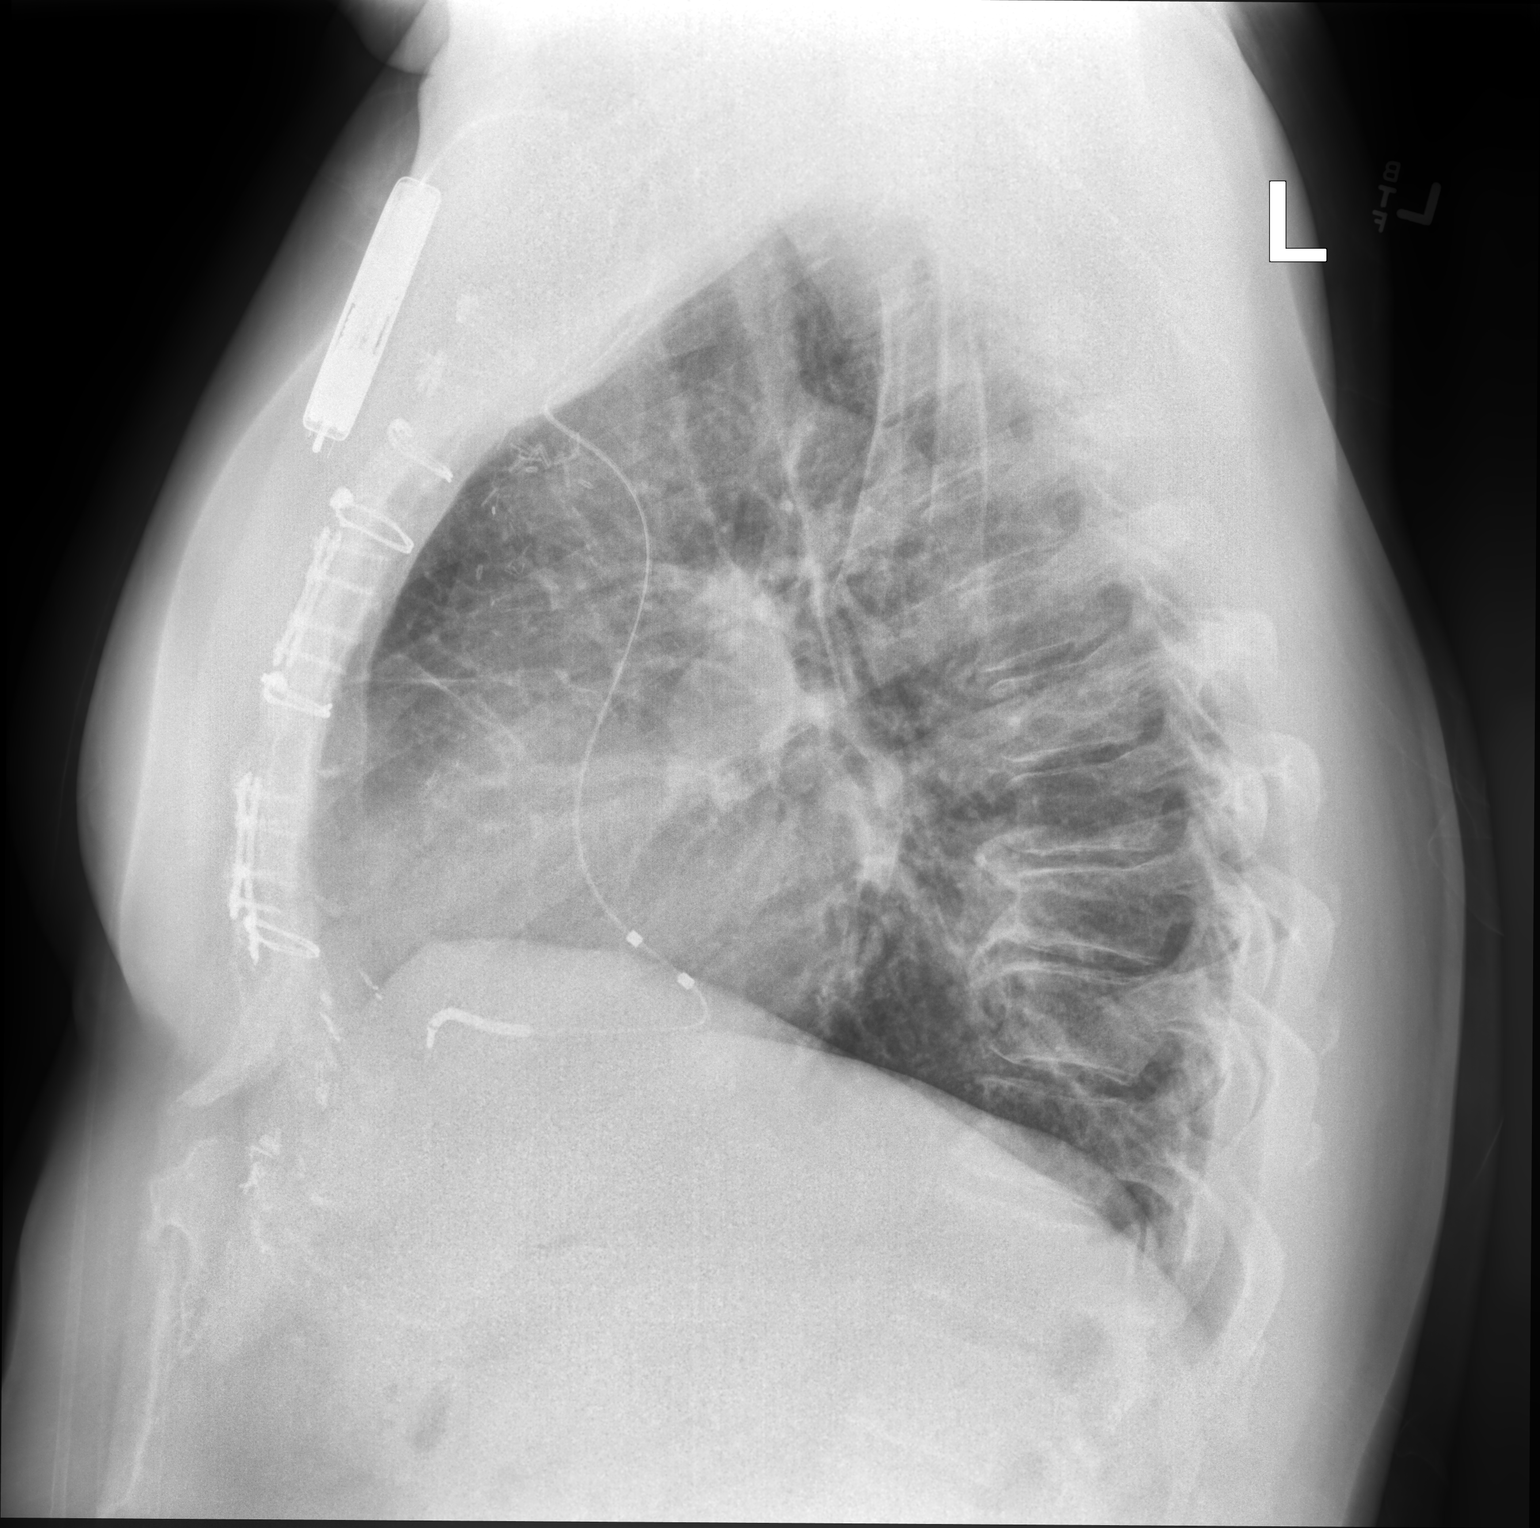

[2 of 2 positions shown; findings below may reference images not displayed]

FINDINGS: Cardiac shadow is mildly enlarged. Postsurgical changes are seen.
Defibrillator is again noted and stable. No focal infiltrate or
sizable effusion is seen. Degenerative changes of the thoracic spine
are noted.
IMPRESSION: No active cardiopulmonary disease.

## 2018-05-17 ENCOUNTER — Telehealth: Payer: Self-pay | Admitting: Cardiology

## 2018-05-17 NOTE — Telephone Encounter (Signed)
New message   Called patient about scheduling appt-recall, pt states he can not come to our office because of his insurance and had to switch to [facility].

## 2018-05-17 NOTE — Telephone Encounter (Signed)
Forwarding to device clinic for [facility]

## 2018-06-01 ENCOUNTER — Telehealth (HOSPITAL_COMMUNITY): Payer: Self-pay | Admitting: *Deleted

## 2018-06-01 NOTE — Telephone Encounter (Signed)
Received referral for pt to participate in cardiac rehab from Dr. Noe Gens at [facility] for CHF.  Pt was referred to our pulmonary rehab program last year but was unable to due to [facility] is out of network for his insurance company.  Pt will need to go to [facility] affiliated program.  Called and left message for Amy [facility] RN Cardiac Wellness Coordinator.  Pt verbalized understanding. Alanson Aly, BSN Cardiac and Emergency planning/management officer

## 2018-06-04 ENCOUNTER — Other Ambulatory Visit: Payer: Self-pay | Admitting: Cardiology

## 2018-06-04 DIAGNOSIS — I4901 Ventricular fibrillation: Secondary | ICD-10-CM

## 2018-06-07 ENCOUNTER — Encounter (HOSPITAL_COMMUNITY): Payer: Self-pay | Admitting: *Deleted

## 2018-08-07 ENCOUNTER — Other Ambulatory Visit: Payer: Self-pay | Admitting: Physician Assistant

## 2018-08-23 ENCOUNTER — Encounter: Payer: Self-pay | Admitting: *Deleted

## 2018-08-23 NOTE — Telephone Encounter (Signed)
Opened in error

## 2018-11-12 ENCOUNTER — Emergency Department (HOSPITAL_COMMUNITY)
Admission: EM | Admit: 2018-11-12 | Discharge: 2018-11-12 | Disposition: A | Discharge status: Home / Self Care | Payer: BLUE CROSS/BLUE SHIELD | Attending: Emergency Medicine | Admitting: Emergency Medicine

## 2018-11-12 ENCOUNTER — Other Ambulatory Visit: Payer: Self-pay

## 2018-11-12 ENCOUNTER — Emergency Department (HOSPITAL_COMMUNITY): Payer: BLUE CROSS/BLUE SHIELD

## 2018-11-12 DIAGNOSIS — Z87891 Personal history of nicotine dependence: Secondary | ICD-10-CM | POA: Diagnosis not present

## 2018-11-12 DIAGNOSIS — Z951 Presence of aortocoronary bypass graft: Secondary | ICD-10-CM | POA: Diagnosis not present

## 2018-11-12 DIAGNOSIS — Z91040 Latex allergy status: Secondary | ICD-10-CM | POA: Diagnosis not present

## 2018-11-12 DIAGNOSIS — J449 Chronic obstructive pulmonary disease, unspecified: Secondary | ICD-10-CM | POA: Insufficient documentation

## 2018-11-12 DIAGNOSIS — I2510 Atherosclerotic heart disease of native coronary artery without angina pectoris: Secondary | ICD-10-CM | POA: Insufficient documentation

## 2018-11-12 DIAGNOSIS — Z7982 Long term (current) use of aspirin: Secondary | ICD-10-CM | POA: Insufficient documentation

## 2018-11-12 DIAGNOSIS — I5022 Chronic systolic (congestive) heart failure: Secondary | ICD-10-CM | POA: Diagnosis not present

## 2018-11-12 DIAGNOSIS — Z79899 Other long term (current) drug therapy: Secondary | ICD-10-CM | POA: Diagnosis not present

## 2018-11-12 DIAGNOSIS — I110 Hypertensive heart disease with heart failure: Secondary | ICD-10-CM | POA: Diagnosis not present

## 2018-11-12 DIAGNOSIS — R55 Syncope and collapse: Secondary | ICD-10-CM | POA: Insufficient documentation

## 2018-11-12 DIAGNOSIS — I252 Old myocardial infarction: Secondary | ICD-10-CM | POA: Insufficient documentation

## 2018-11-12 LAB — COMPREHENSIVE METABOLIC PANEL
ALT: 27 U/L (ref 0–44)
AST: 22 U/L (ref 15–41)
Albumin: 3.9 g/dL (ref 3.5–5.0)
Alkaline Phosphatase: 60 U/L (ref 38–126)
Anion gap: 11 (ref 5–15)
BUN: 13 mg/dL (ref 6–20)
CO2: 22 mmol/L (ref 22–32)
Calcium: 8.9 mg/dL (ref 8.9–10.3)
Chloride: 105 mmol/L (ref 98–111)
Creatinine, Ser: 0.84 mg/dL (ref 0.61–1.24)
GFR calc Af Amer: >60 mL/min (ref >60)
GFR calc non Af Amer: >60 mL/min (ref >60)
Glucose, Bld: 104 mg/dL — ABNORMAL HIGH (ref 70–99)
Potassium: 3.9 mmol/L (ref 3.5–5.1)
Sodium: 138 mmol/L (ref 135–145)
Total Bilirubin: 0.5 mg/dL (ref 0.3–1.2)
Total Protein: 6.7 g/dL (ref 6.5–8.1)

## 2018-11-12 LAB — CBC WITH DIFFERENTIAL/PLATELET
Abs Immature Granulocytes: 0.12 10*3/uL — ABNORMAL HIGH (ref 0.00–0.07)
Basophils Absolute: 0.2 10*3/uL — ABNORMAL HIGH (ref 0.0–0.1)
Basophils Relative: 1 %
Eosinophils Absolute: 0.3 10*3/uL (ref 0.0–0.5)
Eosinophils Relative: 2 %
HCT: 47.3 % (ref 39.0–52.0)
Hemoglobin: 16.1 g/dL (ref 13.0–17.0)
Immature Granulocytes: 1 %
Lymphocytes Relative: 36 %
Lymphs Abs: 4.2 10*3/uL — ABNORMAL HIGH (ref 0.7–4.0)
MCH: 31.2 pg (ref 26.0–34.0)
MCHC: 34 g/dL (ref 30.0–36.0)
MCV: 91.7 fL (ref 80.0–100.0)
Monocytes Absolute: 1.7 10*3/uL — ABNORMAL HIGH (ref 0.1–1.0)
Monocytes Relative: 15 %
Neutro Abs: 5.2 10*3/uL (ref 1.7–7.7)
Neutrophils Relative %: 45 %
Platelets: 418 10*3/uL — ABNORMAL HIGH (ref 150–400)
RBC: 5.16 MIL/uL (ref 4.22–5.81)
RDW: 12.9 % (ref 11.5–15.5)
WBC: 11.6 10*3/uL — ABNORMAL HIGH (ref 4.0–10.5)
nRBC: 0 % (ref 0.0–0.2)

## 2018-11-12 LAB — TROPONIN I (HIGH SENSITIVITY)
Troponin I (High Sensitivity): 8 ng/L (ref <18)
Troponin I (High Sensitivity): 16 ng/L (ref <18)

## 2018-11-12 NOTE — Discharge Instructions (Addendum)
[facility] cardiology will call you today and tell you when they would like you to follow-up this week.  Return if any problems

## 2018-11-12 NOTE — ED Notes (Signed)
Called [employer] and a representative has been paged.
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
## 2018-11-15 ENCOUNTER — Telehealth: Payer: Self-pay | Admitting: Pharmacist

## 2018-11-15 NOTE — Telephone Encounter (Signed)
Received request for Praluent reauthorization - pt has not had any labs drawn since starting Praluent last year and will need lipid panel checked before submitting authorization. Will also need to confirm pt still has [employer] with no planned changes for last year.   Key in covermymeds: [id number]

## 2018-11-15 NOTE — Telephone Encounter (Signed)
Returned call to pt who stated that they are no longer being followed by [facility] due to insurance they are followed by [facility].

## 2018-11-15 NOTE — Telephone Encounter (Signed)
Called and spoke w/pt regarding the renewal for the praluent and th ept stated that they are not being followed by [place]. Instead, they are being followed by [facility]

## 2018-12-25 ENCOUNTER — Other Ambulatory Visit: Payer: Self-pay | Admitting: *Deleted

## 2019-01-22 ENCOUNTER — Other Ambulatory Visit: Payer: Self-pay | Admitting: Pharmacist

## 2019-01-22 MED ORDER — PRALUENT 75 MG/ML ~~LOC~~ SOAJ: 75.0000 mg | SUBCUTANEOUS | 11 refills | Status: AC

## 2019-04-20 ENCOUNTER — Telehealth: Payer: Self-pay | Admitting: Pharmacist

## 2019-04-20 NOTE — Telephone Encounter (Signed)
Received a notification that patients [id number] needed prior auth. Patient no longer follow with [facility]. I faxed request to new cardiologist last week. Received another request. I called pt and left HIPPA compliant message on machine that new office will need to complete PA

## 2019-07-02 DIAGNOSIS — I429 Cardiomyopathy, unspecified: Secondary | ICD-10-CM | POA: Insufficient documentation

## 2019-11-03 DIAGNOSIS — Z8679 Personal history of other diseases of the circulatory system: Secondary | ICD-10-CM | POA: Insufficient documentation

## 2021-01-22 ENCOUNTER — Ambulatory Visit (INDEPENDENT_AMBULATORY_CARE_PROVIDER_SITE_OTHER): Payer: Medicare Other

## 2021-01-22 ENCOUNTER — Encounter: Payer: Self-pay | Admitting: Podiatry

## 2021-01-22 ENCOUNTER — Other Ambulatory Visit: Payer: Self-pay | Admitting: Podiatry

## 2021-01-22 ENCOUNTER — Other Ambulatory Visit: Payer: Self-pay

## 2021-01-22 ENCOUNTER — Ambulatory Visit: Payer: Medicare Other | Admitting: Podiatry

## 2021-01-22 DIAGNOSIS — M2142 Flat foot [pes planus] (acquired), left foot: Secondary | ICD-10-CM | POA: Diagnosis not present

## 2021-01-22 DIAGNOSIS — G4733 Obstructive sleep apnea (adult) (pediatric): Secondary | ICD-10-CM | POA: Insufficient documentation

## 2021-01-22 DIAGNOSIS — M76822 Posterior tibial tendinitis, left leg: Secondary | ICD-10-CM | POA: Diagnosis not present

## 2021-01-22 DIAGNOSIS — M2141 Flat foot [pes planus] (acquired), right foot: Secondary | ICD-10-CM

## 2021-01-22 DIAGNOSIS — M722 Plantar fascial fibromatosis: Secondary | ICD-10-CM | POA: Diagnosis not present

## 2021-01-22 MED ORDER — DEXAMETHASONE SODIUM PHOSPHATE 120 MG/30ML IJ SOLN
2.0000 mg | Freq: Once | INTRAMUSCULAR | Status: AC
  Administered 2021-01-22: 2 mg via INTRA_ARTICULAR
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
## 2021-11-23 ENCOUNTER — Ambulatory Visit: Payer: Medicare Other | Attending: Physician Assistant

## 2021-11-23 DIAGNOSIS — G4486 Cervicogenic headache: Secondary | ICD-10-CM | POA: Insufficient documentation

## 2021-11-23 DIAGNOSIS — M542 Cervicalgia: Secondary | ICD-10-CM | POA: Diagnosis not present

## 2021-12-08 ENCOUNTER — Encounter: Payer: Self-pay | Admitting: Physical Therapy

## 2021-12-08 ENCOUNTER — Ambulatory Visit: Payer: Medicare Other | Admitting: Physical Therapy

## 2021-12-08 DIAGNOSIS — G4486 Cervicogenic headache: Secondary | ICD-10-CM

## 2021-12-08 DIAGNOSIS — M542 Cervicalgia: Secondary | ICD-10-CM | POA: Diagnosis not present
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
## 2021-12-15 ENCOUNTER — Ambulatory Visit: Payer: Medicare Other | Admitting: Physical Therapy

## 2021-12-22 ENCOUNTER — Ambulatory Visit: Payer: Medicare Other | Attending: Physician Assistant | Admitting: Physical Therapy

## 2021-12-22 ENCOUNTER — Encounter: Payer: Self-pay | Admitting: Physical Therapy

## 2021-12-22 DIAGNOSIS — M542 Cervicalgia: Secondary | ICD-10-CM | POA: Insufficient documentation

## 2021-12-22 DIAGNOSIS — G4486 Cervicogenic headache: Secondary | ICD-10-CM | POA: Diagnosis present
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
## 2021-12-25 ENCOUNTER — Ambulatory Visit: Payer: Medicare Other

## 2021-12-29 ENCOUNTER — Ambulatory Visit: Payer: Medicare Other

## 2021-12-29 DIAGNOSIS — M542 Cervicalgia: Secondary | ICD-10-CM | POA: Diagnosis not present

## 2021-12-29 DIAGNOSIS — G4486 Cervicogenic headache: Secondary | ICD-10-CM
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
## 2022-01-05 ENCOUNTER — Encounter: Payer: Self-pay | Admitting: Physical Therapy

## 2022-01-05 ENCOUNTER — Ambulatory Visit: Payer: Medicare Other

## 2022-01-05 ENCOUNTER — Ambulatory Visit: Payer: Medicare Other | Admitting: Physical Therapy

## 2022-01-05 DIAGNOSIS — M542 Cervicalgia: Secondary | ICD-10-CM | POA: Diagnosis not present

## 2022-01-05 DIAGNOSIS — G4486 Cervicogenic headache: Secondary | ICD-10-CM
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
## 2022-01-19 ENCOUNTER — Ambulatory Visit: Payer: Medicare Other | Attending: Physician Assistant | Admitting: Physical Therapy

## 2022-01-19 ENCOUNTER — Encounter: Payer: Self-pay | Admitting: Physical Therapy

## 2022-01-19 DIAGNOSIS — M542 Cervicalgia: Secondary | ICD-10-CM | POA: Diagnosis present

## 2022-01-19 DIAGNOSIS — G4486 Cervicogenic headache: Secondary | ICD-10-CM | POA: Diagnosis present
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
## 2022-06-11 ENCOUNTER — Other Ambulatory Visit (HOSPITAL_COMMUNITY): Payer: Self-pay

## 2023-02-21 ENCOUNTER — Other Ambulatory Visit (HOSPITAL_COMMUNITY): Payer: Self-pay | Admitting: Specialist

## 2023-02-21 DIAGNOSIS — M25562 Pain in left knee: Secondary | ICD-10-CM

## 2023-03-21 ENCOUNTER — Ambulatory Visit (HOSPITAL_COMMUNITY)
Admission: RE | Admit: 2023-03-21 | Discharge: 2023-03-21 | Disposition: A | Discharge status: Home / Self Care | Payer: Medicare Other | Source: Ambulatory Visit | Attending: Specialist | Admitting: Specialist

## 2023-03-21 DIAGNOSIS — M25562 Pain in left knee: Secondary | ICD-10-CM | POA: Diagnosis present

## 2023-03-21 NOTE — Progress Notes (Signed)
  Device system confirmed  to be MRI conditional, with implant date > 6 weeks ago, and no evidence of abandoned or epicardial leads in review of most recent CXR  Device last cleared by EP Provider:    Canary Brim NP 03/21/2023  Clearance is good through for 1 year as long as parameters remain stable at time of check. If pt undergoes a cardiac device procedure during that time, they should be re-cleared.   Tachy-therapies to be programmed off if applicable with device back to pre-MRI settings after completion of exam.  [employer] was available remotely to assist in programming recommendations.   Terri Piedra  03/21/2023 1:46 PM
